# Patient Record
Sex: Female | Born: 1975 | Race: White | Hispanic: No | Marital: Single | Smoking: Never smoker
Health system: Southern US, Community
[De-identification: ages and names within clinical notes are randomized; demographics above are authoritative.]

## PROBLEM LIST (undated history)

## (undated) DIAGNOSIS — E215 Disorder of parathyroid gland, unspecified: Secondary | ICD-10-CM

## (undated) DIAGNOSIS — Z87442 Personal history of urinary calculi: Secondary | ICD-10-CM

## (undated) DIAGNOSIS — F329 Major depressive disorder, single episode, unspecified: Secondary | ICD-10-CM

## (undated) DIAGNOSIS — K219 Gastro-esophageal reflux disease without esophagitis: Secondary | ICD-10-CM

## (undated) DIAGNOSIS — R51 Headache: Secondary | ICD-10-CM

## (undated) DIAGNOSIS — N809 Endometriosis, unspecified: Secondary | ICD-10-CM

## (undated) DIAGNOSIS — F32A Depression, unspecified: Secondary | ICD-10-CM

## (undated) DIAGNOSIS — R519 Headache, unspecified: Secondary | ICD-10-CM

## (undated) DIAGNOSIS — Z87440 Personal history of urinary (tract) infections: Secondary | ICD-10-CM

## (undated) HISTORY — DX: Major depressive disorder, single episode, unspecified: F32.9

## (undated) HISTORY — DX: Depression, unspecified: F32.A

## (undated) HISTORY — DX: Headache, unspecified: R51.9

## (undated) HISTORY — DX: Disorder of parathyroid gland, unspecified: E21.5

## (undated) HISTORY — DX: Personal history of urinary (tract) infections: Z87.440

## (undated) HISTORY — DX: Gastro-esophageal reflux disease without esophagitis: K21.9

## (undated) HISTORY — DX: Headache: R51

## (undated) HISTORY — DX: Endometriosis, unspecified: N80.9

## (undated) HISTORY — DX: Personal history of urinary calculi: Z87.442

---

## 2010-03-13 ENCOUNTER — Ambulatory Visit: Payer: Self-pay | Admitting: Obstetrics and Gynecology

## 2010-03-26 ENCOUNTER — Ambulatory Visit: Payer: Self-pay | Admitting: Obstetrics and Gynecology

## 2010-11-12 ENCOUNTER — Ambulatory Visit: Payer: Self-pay | Admitting: Specialist

## 2011-10-14 ENCOUNTER — Ambulatory Visit: Payer: Self-pay | Admitting: Neurology

## 2012-01-14 HISTORY — PX: PARATHYROIDECTOMY: SHX19

## 2012-01-14 HISTORY — PX: EXCISION OF ENDOMETRIOMA: SHX6473

## 2012-01-14 HISTORY — PX: BONE TUMOR EXCISION: SHX1254

## 2012-01-14 HISTORY — PX: LITHOTRIPSY: SUR834

## 2012-01-22 ENCOUNTER — Ambulatory Visit: Payer: Self-pay | Admitting: Internal Medicine

## 2012-07-13 ENCOUNTER — Emergency Department: Payer: Self-pay | Admitting: Emergency Medicine

## 2012-07-13 LAB — CBC
HGB: 11.2 g/dL — ABNORMAL LOW (ref 12.0–16.0)
MCHC: 33 g/dL (ref 32.0–36.0)
MCV: 80 fL (ref 80–100)
RBC: 4.28 10*6/uL (ref 3.80–5.20)
RDW: 15.8 % — ABNORMAL HIGH (ref 11.5–14.5)
WBC: 12.8 10*3/uL — ABNORMAL HIGH (ref 3.6–11.0)

## 2012-07-13 LAB — URINALYSIS, COMPLETE
Bilirubin,UR: NEGATIVE
Glucose,UR: NEGATIVE mg/dL
Hyaline Cast: 7
Ketone: NEGATIVE
Nitrite: NEGATIVE
Ph: 6
Protein: NEGATIVE
RBC,UR: 27 /HPF
Specific Gravity: 1.018
Squamous Epithelial: 7
WBC UR: 553 /HPF

## 2012-07-13 LAB — COMPREHENSIVE METABOLIC PANEL
Albumin: 3.4 g/dL (ref 3.4–5.0)
Bilirubin,Total: 0.2 mg/dL (ref 0.2–1.0)
Chloride: 109 mmol/L — ABNORMAL HIGH (ref 98–107)
Co2: 26 mmol/L (ref 21–32)
Creatinine: 1.13 mg/dL (ref 0.60–1.30)
EGFR (Non-African Amer.): >60
Glucose: 135 mg/dL — ABNORMAL HIGH (ref 65–99)
Osmolality: 284 (ref 275–301)
SGPT (ALT): 21 U/L (ref 12–78)

## 2012-07-13 LAB — LIPASE, BLOOD: Lipase: 190 U/L (ref 73–393)

## 2012-07-29 ENCOUNTER — Ambulatory Visit: Payer: Self-pay | Admitting: Urology

## 2012-08-17 ENCOUNTER — Ambulatory Visit: Payer: Self-pay | Admitting: Obstetrics and Gynecology

## 2012-08-17 LAB — BASIC METABOLIC PANEL
Anion Gap: 3 — ABNORMAL LOW (ref 7–16)
Calcium, Total: 9.4 mg/dL (ref 8.5–10.1)
Chloride: 105 mmol/L (ref 98–107)
Co2: 30 mmol/L (ref 21–32)
Creatinine: 1.12 mg/dL (ref 0.60–1.30)
EGFR (African American): >60
EGFR (Non-African Amer.): >60
Glucose: 108 mg/dL — ABNORMAL HIGH (ref 65–99)
Osmolality: 277 (ref 275–301)

## 2012-08-17 LAB — PREGNANCY, URINE: Pregnancy Test, Urine: NEGATIVE m[IU]/mL

## 2012-08-17 LAB — CBC
HCT: 34.9 % — ABNORMAL LOW (ref 35.0–47.0)
MCHC: 33.4 g/dL (ref 32.0–36.0)
Platelet: 218 10*3/uL (ref 150–440)
RBC: 4.4 10*6/uL (ref 3.80–5.20)

## 2012-08-18 ENCOUNTER — Ambulatory Visit: Payer: Self-pay | Admitting: Urology

## 2012-08-27 ENCOUNTER — Ambulatory Visit: Payer: Self-pay | Admitting: Obstetrics and Gynecology

## 2012-09-17 ENCOUNTER — Ambulatory Visit: Payer: Self-pay | Admitting: Urology

## 2012-09-30 ENCOUNTER — Ambulatory Visit: Payer: Self-pay | Admitting: Urology

## 2012-10-06 ENCOUNTER — Ambulatory Visit: Payer: Self-pay | Admitting: Urology

## 2012-10-11 ENCOUNTER — Ambulatory Visit: Payer: Self-pay | Admitting: Urology

## 2013-03-09 ENCOUNTER — Ambulatory Visit: Payer: Self-pay | Admitting: Urgent Care

## 2013-04-13 ENCOUNTER — Ambulatory Visit: Payer: Self-pay | Admitting: Urology

## 2013-11-28 ENCOUNTER — Ambulatory Visit: Payer: Self-pay | Admitting: Nurse Practitioner

## 2014-05-05 NOTE — Op Note (Signed)
PATIENT NAME:  Maureen Aguilar, Maureen Aguilar MR#:  [id number] DATE OF BIRTH:  27-Aug-1975  DATE OF PROCEDURE:  08/27/2012  PREOPERATIVE DIAGNOSES: Pelvic pain, endometrioma, uterine polyp.  POSTOPERATIVE DIAGNOSES: Bilateral endometriomas with simple cysts on the left and a large cyst endometrioma on the right, multiple areas of endometrioma in the pelvis and cul-de-sac and normal cystoscopy postoperatively, with a polyp in the uterus.    PROCEDURE:operative laparoscopy and bilateral cystectomy, peritoenal biopsies, and placentn of intercede.  SURGEON: Delsa Sale, M.D.   ASSISTANT: Barnett Applebaum, M.D.  PROCEDURE PERFORMED: The patient was taken the operating room and placed in supine position. After adequate general endotracheal anesthesia was instilled, the patient was prepped and draped in the usual sterile fashion. The patient was placed in [place]. The side-opening speculum was placed in the patient's vagina. The anterior lip of the cervix was grasped and a Hulka tenaculum was placed into the uterus.  Attention was then turned to the umbilicus, where the umbilicus was injected with Marcaine. An incision was cut and carried sharply down to the fascia and Veress needle was placed. Hanging drop test, fluid instillation test and fluid aspiration test showed proper placement of the Veress needle. After the fluid was placed, the CO2 was placed on low flow. When tympany was heard around the liver, CO2 was turned up to high flow. Attention was then turned to the sides of the abdomen, and then first a right and then left port, a 5 and then a 10 port were placed. Upon admission, there was a lot of scarring to the bowels, to the anterior abdominal wall, as well as the ovaries and cul-de-sac. The left one was very close in proximity to the uterus and was adhered with endometriosis or what appeared to be. Prior to opening the uterine cysts, the ureters were searched for and identified bilaterally at the pelvic brim  and followed down to the pelvis. Attention was then turned to the right endometrioma which was grasped and then incised with a Harmonic scalpel. The shell of the endometrioma was peeled off at the very last moment when the endometrioma ruptured and the chocolate syrup came out to the belly. This was rapidly suctioned and irrigated. The patient's other ovary was then addressed. It was incised, opened the same way and then a simple cyst that was there was (Dictation Anodrained. . At this time, there was a large patch of endometriosis on the uterosacral as on the other patches, so these were allinhected with Marcaine to separate them from the background tissues. These were then grasped and removed with the Harmonic scalpel as superficially as possible. These were sent to pathology. There were 2 in the bladder flap, a couple anteriorly, a bunch on the uterosacrals and then some  actually in the folds of the bowels. The patient's tubes were quite reddened. None of this was grasped, none of this was (Dictation Anomaly)biopsied  The patient's trocar ports were removed, these were closed and then hysteroscopy was performed and (Dictation Anomaly) myosure was used to remove this polyp. After that, cystoscopy was done due to the endometriosis to see if it had come back, following up where I had seen it about this time a year ago. Attention was then turned to the cystoscope, where the patient's ureters were found to be patent bilaterally under direct visualization.     ____________________________ Delsa Sale, MD cck:nts D: 08/28/2012 01:09:53 ET T: 08/28/2012 04:16:52 ET JOB#: [id number]  cc: Delsa Sale, MD, <Dictator> Roxene Alviar  Clayburn Pert MD ELECTRONICALLY SIGNED 08/31/2012 9:21

## 2014-06-26 ENCOUNTER — Encounter: Payer: Self-pay | Admitting: Primary Care

## 2014-06-26 ENCOUNTER — Telehealth: Payer: Self-pay | Admitting: Primary Care

## 2014-06-26 ENCOUNTER — Ambulatory Visit (INDEPENDENT_AMBULATORY_CARE_PROVIDER_SITE_OTHER): Payer: 59 | Admitting: Primary Care

## 2014-06-26 VITALS — BP 122/84 | HR 82 | Temp 98.4°F | Ht 66.0 in | Wt 209.4 lb

## 2014-06-26 DIAGNOSIS — G44229 Chronic tension-type headache, not intractable: Secondary | ICD-10-CM | POA: Diagnosis not present

## 2014-06-26 DIAGNOSIS — K219 Gastro-esophageal reflux disease without esophagitis: Secondary | ICD-10-CM | POA: Insufficient documentation

## 2014-06-26 DIAGNOSIS — R51 Headache: Secondary | ICD-10-CM

## 2014-06-26 DIAGNOSIS — F32A Depression, unspecified: Secondary | ICD-10-CM

## 2014-06-26 DIAGNOSIS — R519 Headache, unspecified: Secondary | ICD-10-CM | POA: Insufficient documentation

## 2014-06-26 DIAGNOSIS — F329 Major depressive disorder, single episode, unspecified: Secondary | ICD-10-CM

## 2014-06-26 NOTE — Assessment & Plan Note (Signed)
Tolerable without medications. No migraine symptoms. Will continue to monitor.

## 2014-06-26 NOTE — Patient Instructions (Signed)
You will be contacted regarding your referral to Therapy.  Please let us know if you have not heard back within one week.   Please schedule a physical with me in the next 2-3 months. You will also schedule a lab only appointment one week prior. We will discuss your lab results during your physical.  It was a pleasure to meet you today! Please don't hesitate to call me with any questions. Welcome to [facility]!

## 2014-06-26 NOTE — Telephone Encounter (Signed)
Pt works at [employer] and would like to do her cpx labs there.  The [facility] station on [address] in [place] Her cpx appointment is 08/21/14

## 2014-06-26 NOTE — Assessment & Plan Note (Signed)
Intermittent for years. Takes omeprazole when symptoms arise. Denies chest pain, epigastric fullness, reflux recently. Will continue to monitor.

## 2014-06-26 NOTE — Progress Notes (Signed)
Pre visit review using our clinic review tool, if applicable. No additional management support is needed unless otherwise documented below in the visit note. 

## 2014-06-26 NOTE — Assessment & Plan Note (Signed)
PHQ-9 Score of 15 today. Denies SI/HI.  Recommended therapy and referral placed. Once managed on Lexapro and Citalopram, has not been on meds for 6 months. Will consider introducing after therapy.

## 2014-06-26 NOTE — Progress Notes (Signed)
Subjective:    Patient ID: Maureen Aguilar, female    DOB: Aug 02, 1975, 39 y.o.   MRN: [id number]  HPI  Ms. Maureen Aguilar is a 39 year old female who presents today to establish care and discuss the problems mentioned below. Will obtain old records.  1) Depression: She's been experiencing depression of the past 4 years due to a decrease in health status. Her PCP initiated her on Lexapro which she could not tolerate due to side effects. She was then placed on Citalopram with some improvement. She removed herself off of the Citalopram 6 months ago. PHQ-9 score of 15 today. Denies SI/HI.  2) GERD: Present for years. She experiences symptoms of epigastric fullness/pressure and was found to have a hiatal hernia several years ago. She is taking omeprazole and will take intermittently when needed (about once weekly).  3) Headaches: Present daily and doesn't particularly notice them. She does not currently take any medication including ibuprofen or tylenol.   4) Abdominal pain: Present for several years and is daily. Was following with GI and managed with Lupron for 6 months. Also followed with Urologist and GYN who could not find a reason for abdominal pain. Her CT scan in 2015 and was inconclusive. She does have a history of endometriosis but was not determined to be the cause. Denies nausea, vomiting, blood in stool. She's able to consume solids and liquids without difficulty.  Review of Systems  Constitutional: Positive for fatigue. Negative for unexpected weight change.  HENT: Negative for rhinorrhea.   Respiratory: Negative for cough and shortness of breath.   Cardiovascular: Negative for chest pain.  Gastrointestinal: Positive for abdominal pain. Negative for diarrhea and constipation.  Genitourinary: Negative for dysuria and frequency.  Musculoskeletal: Negative for myalgias and arthralgias.       Present to ankles and hips since treatment with Lupron.  Skin: Negative for rash.    Allergic/Immunologic: Negative for environmental allergies.  Neurological: Positive for headaches. Negative for dizziness and numbness.  Psychiatric/Behavioral:       See HPI.       Past Medical History  Diagnosis Date  . Depression   . Frequent headaches   . GERD (gastroesophageal reflux disease)   . History of kidney stones   . Parathyroid abnormality   . History of UTI   . Endometriosis     History   Social History  . Marital Status: Married    Spouse Name: N/A  . Number of Children: N/A  . Years of Education: N/A   Occupational History  . Not on file.   Social History Main Topics  . Smoking status: Never Smoker   . Smokeless tobacco: Not on file  . Alcohol Use: No  . Drug Use: Not on file  . Sexual Activity: Not on file   Other Topics Concern  . Not on file   Social History Narrative   Single.   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   Highest level of education 12th grade.   Enjoys reading, driving.    Past Surgical History  Procedure Laterality Date  . 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 Parathyroidectomy  2014    removal of one.  . Lithotripsy  2014  . Excision of endometrioma  2014    Family History  Problem Relation Age of Onset  . Alcohol abuse Mother   . Arthritis Mother   . Mental illness  Mother   . Mental illness Brother     bipolar  . Cancer Maternal Aunt     breast  . Mental illness Maternal Grandmother   . Asthma Maternal Grandmother   . Hyperlipidemia Maternal Grandmother   . Hypertension Maternal Grandmother   . Heart disease Maternal Grandfather     No Known Allergies  No current outpatient prescriptions on file prior to visit.   No current facility-administered medications on file prior to visit.    BP 122/84 mmHg  Pulse 82  Temp(Src) 98.4 F (36.9 C) (Oral)  Ht 5\' 6"  (1.676 m)  Wt 209 lb 6.4 oz (94.983 kg)  BMI 33.81 kg/m2  SpO2 98%  LMP 06/19/2014    Objective:    Physical Exam  Constitutional: She is oriented to person, place, and time. She appears well-nourished.  Cardiovascular: Normal rate and regular rhythm.   Neurological: She is alert and oriented to person, place, and time.  Skin: Skin is warm and dry.  Psychiatric: She has a normal mood and affect.          Assessment & Plan:

## 2014-07-03 ENCOUNTER — Other Ambulatory Visit: Payer: Self-pay | Admitting: Primary Care

## 2014-07-03 DIAGNOSIS — Z0000 Encounter for general adult medical examination without abnormal findings: Secondary | ICD-10-CM

## 2014-08-09 ENCOUNTER — Telehealth: Payer: Self-pay | Admitting: Primary Care

## 2014-08-09 NOTE — Telephone Encounter (Signed)
Called and notified patient of Kate's comments. Patient verbalized understanding.  

## 2014-08-09 NOTE — Telephone Encounter (Signed)
Yes, orders have been placed for [facility]. Please notify patient.

## 2014-08-09 NOTE — Telephone Encounter (Signed)
Pt called she has cpx on 8/8 and wanted to get her labs done at [facility]   Has order been sent  Please advise pt

## 2014-08-10 ENCOUNTER — Ambulatory Visit (INDEPENDENT_AMBULATORY_CARE_PROVIDER_SITE_OTHER)
Admission: RE | Admit: 2014-08-10 | Discharge: 2014-08-10 | Disposition: A | Discharge status: Home / Self Care | Payer: 59 | Source: Ambulatory Visit | Attending: Primary Care | Admitting: Primary Care

## 2014-08-10 ENCOUNTER — Ambulatory Visit (INDEPENDENT_AMBULATORY_CARE_PROVIDER_SITE_OTHER): Payer: 59 | Admitting: Primary Care

## 2014-08-10 ENCOUNTER — Encounter: Payer: Self-pay | Admitting: Primary Care

## 2014-08-10 VITALS — BP 124/84 | HR 89 | Temp 97.8°F | Ht 66.0 in | Wt 211.8 lb

## 2014-08-10 DIAGNOSIS — R109 Unspecified abdominal pain: Secondary | ICD-10-CM

## 2014-08-10 DIAGNOSIS — R339 Retention of urine, unspecified: Secondary | ICD-10-CM | POA: Diagnosis not present

## 2014-08-10 LAB — POCT URINALYSIS DIPSTICK
Bilirubin, UA: NEGATIVE
Blood, UA: NEGATIVE
GLUCOSE UA: NEGATIVE
KETONES UA: NEGATIVE
Leukocytes, UA: NEGATIVE
Nitrite, UA: NEGATIVE
Protein, UA: NEGATIVE
Spec Grav, UA: >=1.03
Urobilinogen, UA: NEGATIVE
pH, UA: 6

## 2014-08-10 NOTE — Progress Notes (Signed)
Subjective:    Patient ID: Maureen Aguilar, female    DOB: 1975-12-02, 39 y.o.   MRN: [id number]  HPI  Ms. Maureen Aguilar is a 39 year old female who presents today with a chief complaint of back pain. Her back pain is located to bilateral lower, lateral side of back and has been present for 2-3 weeks. She denies recent injury. She describes her pain as a deep, dull, ache. She's tried taking tylenol without relief. Nothing helps to improve her pain. 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 Denies dysuria, hematuria, frequency, fevers, chills.  Review of Systems  Constitutional: Negative for fever and chills.  Gastrointestinal: Negative for nausea and vomiting.  Genitourinary: Negative for dysuria, urgency, frequency, flank pain and vaginal discharge.       Feeling of residual urine after urinating.  Musculoskeletal: Positive for back pain.       Past Medical History  Diagnosis Date  . Depression   . Frequent headaches   . GERD (gastroesophageal reflux disease)   . History of kidney stones   . Parathyroid abnormality   . History of UTI   . Endometriosis     History   Social History  . Marital Status: Married    Spouse Name: N/A  . Number of Children: N/A  . Years of Education: N/A   Occupational History  . Not on file.   Social History Main Topics  . Smoking status: Never Smoker   . Smokeless tobacco: Not on file  . Alcohol Use: No  . Drug Use: Not on file  . Sexual Activity: Not on file   Other Topics Concern  . Not on file   Social History Narrative   Single.   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   Highest level of education 12th grade.   Enjoys reading, driving.    Past Surgical History  Procedure Laterality Date  . 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 Parathyroidectomy  2014    removal of one.  . Lithotripsy  2014  . Excision of endometrioma  2014    Family  History  Problem Relation Age of Onset  . Alcohol abuse Mother   . Arthritis Mother   . Mental illness Mother   . Mental illness Brother     bipolar  . Cancer Maternal Aunt     breast  . Mental illness Maternal Grandmother   . Asthma Maternal Grandmother   . Hyperlipidemia Maternal Grandmother   . Hypertension Maternal Grandmother   . Heart disease Maternal Grandfather     No Known Allergies  Current Outpatient Prescriptions on File Prior to Visit  Medication Sig Dispense Refill  . Norethindrone Acetate-Ethinyl Estrad-FE (LOESTRIN 24 FE) 1-20 MG-MCG(24) tablet Take 1 pill daily  3   No current facility-administered medications on file prior to visit.    BP 124/84 mmHg  Pulse 89  Temp(Src) 97.8 F (36.6 C) (Oral)  Ht 5\' 6"  (1.676 m)  Wt 211 lb 12.8 oz (96.072 kg)  BMI 34.20 kg/m2  SpO2 98%  LMP 07/19/2014    Objective:   Physical Exam  Constitutional: She appears well-nourished. She does not appear ill.  Cardiovascular: Normal rate and regular rhythm.   Pulmonary/Chest: Effort normal and breath sounds normal.  Abdominal: Soft. Bowel sounds are normal. There is CVA tenderness.  Skin: Skin is warm and dry.  Assessment & Plan:  Flank pain/Urinary retention:  Bilateral lateral low back/flank pain x 2-3 weeks. UA: Negative for leuks, nitrites, blood, glucose. Slight CVA tenderness. Will get KUB to rule out stones. Tylenol or ibuprofen PRN pain. Follow up PRN.

## 2014-08-10 NOTE — Patient Instructions (Signed)
Your urine does not show infection.  Complete xray(s) prior to leaving today. I will contact you regarding your results.  Please update me on your overall status. Try taking tylenol for your pain.  It was nice to see you!

## 2014-08-10 NOTE — Progress Notes (Signed)
Pre visit review using our clinic review tool, if applicable. No additional management support is needed unless otherwise documented below in the visit note. 

## 2014-08-17 ENCOUNTER — Other Ambulatory Visit: Payer: Self-pay | Admitting: Primary Care

## 2014-08-17 ENCOUNTER — Telehealth: Payer: Self-pay | Admitting: Primary Care

## 2014-08-17 DIAGNOSIS — Z0000 Encounter for general adult medical examination without abnormal findings: Secondary | ICD-10-CM

## 2014-08-17 NOTE — Telephone Encounter (Signed)
erro

## 2014-08-18 ENCOUNTER — Other Ambulatory Visit: Payer: Self-pay | Admitting: Primary Care

## 2014-08-19 LAB — CBC WITH DIFFERENTIAL/PLATELET
BASOS ABS: 0 10*3/uL (ref 0.0–0.2)
Basos: 0 %
EOS (ABSOLUTE): 0.1 10*3/uL (ref 0.0–0.4)
EOS: 1 %
HEMATOCRIT: 40.9 % (ref 34.0–46.6)
Hemoglobin: 13.9 g/dL (ref 11.1–15.9)
IMMATURE GRANULOCYTES: 0 %
Immature Grans (Abs): 0 10*3/uL (ref 0.0–0.1)
LYMPHS: 21 %
Lymphocytes Absolute: 2 10*3/uL (ref 0.7–3.1)
MCH: 29.9 pg (ref 26.6–33.0)
MCHC: 34 g/dL (ref 31.5–35.7)
MCV: 88 fL (ref 79–97)
Monocytes Absolute: 0.8 10*3/uL (ref 0.1–0.9)
Monocytes: 8 %
NEUTROS PCT: 70 %
Neutrophils Absolute: 6.8 10*3/uL (ref 1.4–7.0)
Platelets: 276 10*3/uL (ref 150–379)
RBC: 4.65 x10E6/uL (ref 3.77–5.28)
RDW: 12.8 % (ref 12.3–15.4)
WBC: 9.7 10*3/uL (ref 3.4–10.8)

## 2014-08-19 LAB — LIPID PANEL
Chol/HDL Ratio: 3.8 ratio units (ref 0.0–4.4)
Cholesterol, Total: 176 mg/dL (ref 100–199)
HDL: 46 mg/dL (ref >39)
LDL Calculated: 85 mg/dL (ref 0–99)
Triglycerides: 227 mg/dL — ABNORMAL HIGH (ref 0–149)
VLDL Cholesterol Cal: 45 mg/dL — ABNORMAL HIGH (ref 5–40)

## 2014-08-19 LAB — HEMOGLOBIN A1C
Est. average glucose Bld gHb Est-mCnc: 114 mg/dL
HEMOGLOBIN A1C: 5.6 % (ref 4.8–5.6)

## 2014-08-19 LAB — COMPREHENSIVE METABOLIC PANEL
A/G RATIO: 1.5 (ref 1.1–2.5)
ALBUMIN: 4.2 g/dL (ref 3.5–5.5)
ALK PHOS: 45 IU/L (ref 39–117)
ALT: 17 IU/L (ref 0–32)
AST: 13 IU/L (ref 0–40)
BUN/Creatinine Ratio: 15 (ref 9–23)
BUN: 15 mg/dL (ref 6–24)
Bilirubin Total: 0.3 mg/dL (ref 0.0–1.2)
CALCIUM: 9.8 mg/dL (ref 8.7–10.2)
CO2: 22 mmol/L (ref 18–29)
Chloride: 100 mmol/L (ref 97–108)
Creatinine, Ser: 1 mg/dL (ref 0.57–1.00)
GFR calc Af Amer: 81 mL/min/{1.73_m2} (ref >59)
GFR, EST NON AFRICAN AMERICAN: 70 mL/min/{1.73_m2} (ref >59)
Globulin, Total: 2.8 g/dL (ref 1.5–4.5)
Glucose: 92 mg/dL (ref 65–99)
POTASSIUM: 4.8 mmol/L (ref 3.5–5.2)
Sodium: 139 mmol/L (ref 134–144)
TOTAL PROTEIN: 7 g/dL (ref 6.0–8.5)

## 2014-08-19 LAB — VITAMIN D 25 HYDROXY (VIT D DEFICIENCY, FRACTURES): Vit D, 25-Hydroxy: 33.1 ng/mL (ref 30.0–100.0)

## 2014-08-19 LAB — TSH: TSH: 2.21 u[IU]/mL (ref 0.450–4.500)

## 2014-08-21 ENCOUNTER — Ambulatory Visit (INDEPENDENT_AMBULATORY_CARE_PROVIDER_SITE_OTHER): Payer: 59 | Admitting: Primary Care

## 2014-08-21 ENCOUNTER — Encounter: Payer: Self-pay | Admitting: Primary Care

## 2014-08-21 ENCOUNTER — Other Ambulatory Visit: Payer: Self-pay | Admitting: Primary Care

## 2014-08-21 VITALS — BP 120/84 | HR 80 | Temp 98.5°F | Ht 66.0 in | Wt 207.8 lb

## 2014-08-21 DIAGNOSIS — Z0000 Encounter for general adult medical examination without abnormal findings: Secondary | ICD-10-CM | POA: Diagnosis not present

## 2014-08-21 DIAGNOSIS — F32A Depression, unspecified: Secondary | ICD-10-CM

## 2014-08-21 DIAGNOSIS — Z23 Encounter for immunization: Secondary | ICD-10-CM

## 2014-08-21 DIAGNOSIS — F329 Major depressive disorder, single episode, unspecified: Secondary | ICD-10-CM

## 2014-08-21 DIAGNOSIS — Z1211 Encounter for screening for malignant neoplasm of colon: Secondary | ICD-10-CM | POA: Insufficient documentation

## 2014-08-21 NOTE — Assessment & Plan Note (Signed)
Due for tetanus today. Will administer. Labs mostly unremarkable. Trigs up, will initiate daily fish oil.  Vitamin D on lower level of normal. Will initial OTC vitamin D with calcium Exam unremarkable. Follows with GYN for Paps. She will schedule a mammogram.

## 2014-08-21 NOTE — Progress Notes (Signed)
Pre visit review using our clinic review tool, if applicable. No additional management support is needed unless otherwise documented below in the visit note. 

## 2014-08-21 NOTE — Addendum Note (Signed)
Addended by: Jacqualin Combes on: 08/21/2014 01:32 PM   Modules accepted: Orders

## 2014-08-21 NOTE — Progress Notes (Signed)
Subjective:    Patient ID: Maureen Aguilar, female    DOB: 03/19/1975, 40 y.o.   MRN: [id number]  HPI  Ms. Maureen Aguilar is a 39 year old female who presents today for complete physical.  Immunizations: -Tetanus: Unsure of last tetanus. Believes it's been more than 10 years ago.  -Influenza: Did not complete last season.   Diet: Endorses a healthy diet. Breakfast: Cereal (cherrios), toast Lunch: Skips Dinner: Baked, grilled chicken. Limited red meat or pork. Vegetables, rice. Beverages: Water, lipton diet green tea Exercise: She is not currently exercising Eye exam: Unsure. Denies changes in vision. Dental exam: Completed earlier in 2016. Pap Smear: Completed in 2015, follows with GYN Mammogram: Completed 1 year ago. Normal exam.  1) Depression: During initial visit in June, noted a PHQ-9 score of 15. She was referred to therapy as she was once managed on medications. She works night shift and has been unable to coordinate an appointment with therapy as of yet.   Review of Systems  Constitutional: Negative for unexpected weight change.  HENT: Negative for rhinorrhea.   Eyes: Negative for visual disturbance.  Respiratory: Negative for cough and shortness of breath.   Cardiovascular: Negative for chest pain.  Gastrointestinal: Negative for diarrhea and constipation.  Genitourinary: Negative for difficulty urinating.       Regular periods  Musculoskeletal: Negative for myalgias and arthralgias.  Skin: Negative for rash.  Neurological: Negative for dizziness, numbness and headaches.  Psychiatric/Behavioral:       Denies concerns for anxiety or depression       Past Medical History  Diagnosis Date  . Depression   . Frequent headaches   . GERD (gastroesophageal reflux disease)   . History of kidney stones   . Parathyroid abnormality   . History of UTI   . Endometriosis     History   Social History  . Marital Status: Married    Spouse Name: N/A  . Number of Children:  N/A  . Years of Education: N/A   Occupational History  . Not on file.   Social History Main Topics  . Smoking status: Never Smoker   . Smokeless tobacco: Not on file  . Alcohol Use: No  . Drug Use: Not on file  . Sexual Activity: Not on file   Other Topics Concern  . Not on file   Social History Narrative   Single.   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   Highest level of education 12th grade.   Enjoys reading, driving.    Past Surgical History  Procedure Laterality Date  . 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 Parathyroidectomy  2014    removal of one.  . Lithotripsy  2014  . Excision of endometrioma  2014    Family History  Problem Relation Age of Onset  . Alcohol abuse Mother   . Arthritis Mother   . Mental illness Mother   . Mental illness Brother     bipolar  . Cancer Maternal Aunt     breast  . Mental illness Maternal Grandmother   . Asthma Maternal Grandmother   . Hyperlipidemia Maternal Grandmother   . Hypertension Maternal Grandmother   . Heart disease Maternal Grandfather     No Known Allergies  Current Outpatient Prescriptions on File Prior to Visit  Medication Sig Dispense Refill  . Norethindrone Acetate-Ethinyl Estrad-FE (LOESTRIN 24 FE) 1-20 MG-MCG(24) tablet Take 1  pill daily  3   No current facility-administered medications on file prior to visit.    BP 120/84 mmHg  Pulse 80  Temp(Src) 98.5 F (36.9 C) (Oral)  Ht 5\' 6"  (1.676 m)  Wt 207 lb 12.8 oz (94.257 kg)  BMI 33.56 kg/m2  SpO2 98%  LMP 07/19/2014    Objective:   Physical Exam  Constitutional: She is oriented to person, place, and time. She appears well-nourished.  HENT:  Right Ear: Tympanic membrane and ear canal normal.  Left Ear: Tympanic membrane and ear canal normal.  Nose: Nose normal.  Mouth/Throat: Oropharynx is clear and moist.  Eyes: Conjunctivae and EOM are normal. Pupils are equal, round, and reactive to light.    Neck: Neck supple. No thyromegaly present.  Cardiovascular: Normal rate and regular rhythm.   Pulmonary/Chest: Effort normal and breath sounds normal.  Abdominal: Soft. Bowel sounds are normal. There is no tenderness.  Musculoskeletal: Normal range of motion.  Lymphadenopathy:    She has no cervical adenopathy.  Neurological: She is alert and oriented to person, place, and time. She has normal reflexes.  Skin: Skin is warm and dry.  Psychiatric: She has a normal mood and affect.          Assessment & Plan:

## 2014-08-21 NOTE — Assessment & Plan Note (Addendum)
Has not connected with therapy. Will have her stop by today and speak with [facility] regarding an appointment. Follow up in 3 months.

## 2014-08-21 NOTE — Patient Instructions (Signed)
You've been provided with a tetanus vaccination today. You will be covered for 10 years.  Start taking daily Fish Oil 2000mg , which may be purchased over the counter.  Start taking a calcium with vitamin D supplement. You need at least 1200 mg of calcium and 800 units of vitamin D.  Stop by the front and speak with Rosaria Ferries regarding your referral to therapy.  Follow up in 3 months for re-evaluation of depression.  It was a pleasure to see you today!

## 2014-10-26 ENCOUNTER — Telehealth: Payer: Self-pay | Admitting: Primary Care

## 2014-10-26 NOTE — Telephone Encounter (Signed)
Noted. Do they have the correct contact information?

## 2014-10-26 NOTE — Telephone Encounter (Signed)
FYI: Received staff message from [facility] ([place]) that they have reached out to the patient to schedule. Pt has not returned any of their calls.

## 2014-10-27 NOTE — Telephone Encounter (Signed)
Yes. They have access to Epic

## 2014-11-03 ENCOUNTER — Telehealth: Payer: Self-pay | Admitting: Primary Care

## 2014-11-03 NOTE — Telephone Encounter (Signed)
Form placed in Chan's box and is ready for pick up.

## 2014-11-03 NOTE — Telephone Encounter (Signed)
Pt dropped off an appeal form for her health screening for work. The best number to contact her is [phone]. Placing ppw in Kate's tower.

## 2014-11-03 NOTE — Telephone Encounter (Signed)
Place form in Kate's inbox. 

## 2014-11-06 NOTE — Telephone Encounter (Signed)
Called and notified patient of Kate's comments. Patient verbalized understanding. Will be faxing the form to e[employer] screening at [phone]. Sent thru the mail, the original for patient and copy to be scan.

## 2014-11-20 ENCOUNTER — Ambulatory Visit: Payer: 59 | Admitting: Primary Care

## 2015-03-28 ENCOUNTER — Encounter: Payer: Self-pay | Admitting: Primary Care

## 2015-03-28 ENCOUNTER — Ambulatory Visit (INDEPENDENT_AMBULATORY_CARE_PROVIDER_SITE_OTHER): Payer: 59 | Admitting: Primary Care

## 2015-03-28 VITALS — BP 122/84 | HR 87 | Temp 98.4°F | Ht 66.0 in | Wt 207.4 lb

## 2015-03-28 DIAGNOSIS — G44209 Tension-type headache, unspecified, not intractable: Secondary | ICD-10-CM

## 2015-03-28 DIAGNOSIS — G44229 Chronic tension-type headache, not intractable: Secondary | ICD-10-CM | POA: Diagnosis not present

## 2015-03-28 MED ORDER — SUMATRIPTAN SUCCINATE 25 MG PO TABS: ORAL_TABLET | ORAL | Status: DC

## 2015-03-28 NOTE — Patient Instructions (Signed)
Take 1 Imitrex (sumatriptan) tablet when you get home for your headache. You may take a second tablet in 2 hours if no resolve. Do not exceed 2 tablets in 24 hours.   Please call me if no improvement.  It was a pleasure to see you today!

## 2015-03-28 NOTE — Progress Notes (Signed)
Subjective:    Patient ID: Maureen Aguilar, female    DOB: March 09, 1975, 40 y.o.   MRN: [id number]  HPI  [name] is a 40 year old female who presents today with a chief complaint of headache. Her headache is located to the entire cranium, but worse to the bilateral temporal region. She describes her pain as stinging, pressure, and pinching. She reports nausea without vomiting and denies photophobia. Her headache began 1.5 weeks ago and has been persistent. Her pain is no worse, but has not improved. She's taken tylenol, ibuprofen, excedrin migraine without improvement.   Review of Systems  Constitutional: Negative for fever and chills.  HENT: Negative for congestion, sinus pressure and sore throat.   Eyes: Negative for photophobia and visual disturbance.  Respiratory: Negative for cough.   Gastrointestinal: Positive for nausea.  Neurological: Positive for dizziness and headaches.       Past Medical History  Diagnosis Date  . Depression   . Frequent headaches   . GERD (gastroesophageal reflux disease)   . History of kidney stones   . Parathyroid abnormality ([place])   . History of UTI   . Endometriosis     Social History   Social History  . Marital Status: Married    Spouse Name: N/A  . Number of Children: N/A  . Years of Education: N/A   Occupational History  . Not on file.   Social History Main Topics  . Smoking status: Never Smoker   . Smokeless tobacco: Not on file  . Alcohol Use: No  . Drug Use: Not on file  . Sexual Activity: Not on file   Other Topics Concern  . Not on file   Social History Narrative   Single.   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   Highest level of education 12th grade.   Enjoys reading, driving.    Past Surgical History  Procedure Laterality Date  . 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 Parathyroidectomy  2014    removal of one.  . Lithotripsy  2014  . Excision of endometrioma  2014     Family History  Problem Relation Age of Onset  . Alcohol abuse Mother   . Arthritis Mother   . Mental illness Mother   . Mental illness Brother     bipolar  . Cancer Maternal Aunt     breast  . Mental illness Maternal Grandmother   . Asthma Maternal Grandmother   . Hyperlipidemia Maternal Grandmother   . Hypertension Maternal Grandmother   . Heart disease Maternal Grandfather     No Known Allergies  Current Outpatient Prescriptions on File Prior to Visit  Medication Sig Dispense Refill  . Norethindrone Acetate-Ethinyl Estrad-FE (LOESTRIN 24 FE) 1-20 MG-MCG(24) tablet Take 1 pill daily  3   No current facility-administered medications on file prior to visit.    BP 122/84 mmHg  Pulse 87  Temp(Src) 98.4 F (36.9 C) (Oral)  Ht 5\' 6"  (1.676 m)  Wt 207 lb 6.4 oz (94.076 kg)  BMI 33.49 kg/m2  SpO2 97%  LMP 03/07/2015    Objective:   Physical Exam  Constitutional: She appears well-nourished.  Eyes: EOM are normal. Pupils are equal, round, and reactive to light.  Cardiovascular: Normal rate and regular rhythm.   Pulmonary/Chest: Effort normal and breath sounds normal.  Neurological: No cranial nerve deficit.  Skin: Skin is warm and dry.  Assessment & Plan:

## 2015-03-28 NOTE — Progress Notes (Signed)
Pre visit review using our clinic review tool, if applicable. No additional management support is needed unless otherwise documented below in the visit note. 

## 2015-03-28 NOTE — Assessment & Plan Note (Signed)
Persistent headache for 1.5 weeks without improvement in OTC treatment. +nausea, no photophobia. Appears to be tension headache that is unresponsive to conservative treatment. Neuro exam unremarkable. FH of aneurysm to brain. Will treat with abortive therapy. RX for Imitrex sent to pharmacy. Discussed dosing and how to take. She will keep me updated if no improvement.

## 2015-04-01 ENCOUNTER — Encounter: Payer: Self-pay | Admitting: Primary Care

## 2015-04-02 ENCOUNTER — Other Ambulatory Visit: Payer: Self-pay | Admitting: Primary Care

## 2015-04-02 DIAGNOSIS — G43801 Other migraine, not intractable, with status migrainosus: Secondary | ICD-10-CM

## 2015-04-02 MED ORDER — SUMATRIPTAN SUCCINATE 50 MG PO TABS: ORAL_TABLET | ORAL | Status: DC

## 2015-05-28 ENCOUNTER — Ambulatory Visit (INDEPENDENT_AMBULATORY_CARE_PROVIDER_SITE_OTHER): Payer: 59 | Admitting: Internal Medicine

## 2015-05-28 ENCOUNTER — Ambulatory Visit (INDEPENDENT_AMBULATORY_CARE_PROVIDER_SITE_OTHER)
Admission: RE | Admit: 2015-05-28 | Discharge: 2015-05-28 | Disposition: A | Discharge status: Home / Self Care | Payer: 59 | Source: Ambulatory Visit | Attending: Internal Medicine | Admitting: Internal Medicine

## 2015-05-28 ENCOUNTER — Telehealth: Payer: Self-pay | Admitting: Primary Care

## 2015-05-28 ENCOUNTER — Encounter: Payer: Self-pay | Admitting: Internal Medicine

## 2015-05-28 VITALS — BP 124/86 | HR 89 | Temp 99.3°F | Wt 209.0 lb

## 2015-05-28 DIAGNOSIS — R0602 Shortness of breath: Secondary | ICD-10-CM | POA: Diagnosis not present

## 2015-05-28 DIAGNOSIS — R059 Cough, unspecified: Secondary | ICD-10-CM

## 2015-05-28 DIAGNOSIS — R05 Cough: Secondary | ICD-10-CM

## 2015-05-28 DIAGNOSIS — R509 Fever, unspecified: Secondary | ICD-10-CM | POA: Diagnosis not present

## 2015-05-28 NOTE — Patient Instructions (Signed)

## 2015-05-28 NOTE — Progress Notes (Signed)
HPI  Pt presents to the clinic today with c/o runny nose, cough and shortness of breath. This started 10 days ago. She was seen at [facility] for the same 5/7, diagnosed with bronchitis and treated with Prednisone, cough syrup and an inhaler. She has not noticed much improvement. She is blowing clear mucous out of her nose. The cough is non productive. She is mildly short of breath but denies chest pain. She has run low grade fever and had chills. She has taken Mucinex, Tessalon and Robitussin with minimal relief. She has no history of allergies or asthma. She does not smoke. She has not had sick contacts that she is aware of. She is on OCP, but has not noticed any swelling or pain in her legs. She has no family history of clotting disorders.  Review of Systems      Past Medical History  Diagnosis Date  . Depression   . Frequent headaches   . GERD (gastroesophageal reflux disease)   . History of kidney stones   . Parathyroid abnormality ([place])   . History of UTI   . Endometriosis     Family History  Problem Relation Age of Onset  . Alcohol abuse Mother   . Arthritis Mother   . Mental illness Mother   . Mental illness Brother     bipolar  . Cancer Maternal Aunt     breast  . Mental illness Maternal Grandmother   . Asthma Maternal Grandmother   . Hyperlipidemia Maternal Grandmother   . Hypertension Maternal Grandmother   . Heart disease Maternal Grandfather     Social History   Social History  . Marital Status: Married    Spouse Name: N/A  . Number of Children: N/A  . Years of Education: N/A   Occupational History  . Not on file.   Social History Main Topics  . Smoking status: Never Smoker   . Smokeless tobacco: Not on file  . Alcohol Use: No  . Drug Use: Not on file  . Sexual Activity: Not on file   Other Topics Concern  . Not on file   Social History Narrative   Single.   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   Highest level of education  12th grade.   Enjoys reading, driving.    No Known Allergies   Constitutional: Positive fever. Denies headache, fatigue or abrupt weight changes.  HEENT:  Positive runny nose. Denies eye redness, eye pain, pressure behind the eyes, facial pain, nasal congestion, ear pain, ringing in the ears, wax buildup or sore throat. Respiratory: Positive cough and shortness of breath. Denies difficulty breathing.  Cardiovascular: Denies chest pain, chest tightness, palpitations or swelling in the hands or feet.   No other specific complaints in a complete review of systems (except as listed in HPI above).  Objective:   BP 124/86 mmHg  Pulse 89  Temp(Src) 99.3 F (37.4 C) (Oral)  Wt 209 lb (94.802 kg)  SpO2 97%  LMP 04/28/2015 Wt Readings from Last 3 Encounters:  05/28/15 209 lb (94.802 kg)  03/28/15 207 lb 6.4 oz (94.076 kg)  08/21/14 207 lb 12.8 oz (94.257 kg)     General: Appears her stated age, in NAD. HEENT: Head: normal shape and size; Eyes: sclera white, no icterus, conjunctiva pink; Throat/Mouth: + PND. Teeth present, mucosa pink and moist, no exudate noted, no lesions or ulcerations noted.  Neck: Cervical lymphadenopathy bilaterally.  Cardiovascular: Normal rate and rhythm.  S1,S2 noted.  No murmur, rubs or gallops noted.  Pulmonary/Chest: Normal effort and positive vesicular breath sounds. No respiratory distress. No wheezes, rales or ronchi noted.      Assessment & Plan:   Fever, Cough and SOB:  ? Post infectious cough vs PNA Get some rest and drink plenty of water Chest xray to r/o pneumonia If noted infiltrate, will call in Azithromax Otherwise, continue Tessalon, try Delsym OTC  RTC as needed or if symptoms persist.

## 2015-05-28 NOTE — Telephone Encounter (Signed)
Opened in error

## 2015-05-28 NOTE — Progress Notes (Signed)
Pre visit review using our clinic review tool, if applicable. No additional management support is needed unless otherwise documented below in the visit note. 

## 2015-10-15 ENCOUNTER — Telehealth: Payer: Self-pay | Admitting: Primary Care

## 2015-10-15 ENCOUNTER — Encounter: Payer: Self-pay | Admitting: Primary Care

## 2015-10-15 ENCOUNTER — Ambulatory Visit (INDEPENDENT_AMBULATORY_CARE_PROVIDER_SITE_OTHER): Payer: 59 | Admitting: Primary Care

## 2015-10-15 ENCOUNTER — Ambulatory Visit (INDEPENDENT_AMBULATORY_CARE_PROVIDER_SITE_OTHER)
Admission: RE | Admit: 2015-10-15 | Discharge: 2015-10-15 | Disposition: A | Discharge status: Home / Self Care | Payer: 59 | Source: Ambulatory Visit | Attending: Primary Care | Admitting: Primary Care

## 2015-10-15 VITALS — BP 136/86 | HR 85 | Temp 99.0°F | Ht 66.0 in | Wt 208.1 lb

## 2015-10-15 DIAGNOSIS — M545 Low back pain: Secondary | ICD-10-CM | POA: Diagnosis not present

## 2015-10-15 DIAGNOSIS — G8929 Other chronic pain: Secondary | ICD-10-CM

## 2015-10-15 MED ORDER — TIZANIDINE HCL 2 MG PO TABS: 2.0000 mg | ORAL_TABLET | Freq: Three times a day (TID) | ORAL | 0 refills | Status: DC | PRN

## 2015-10-15 NOTE — Telephone Encounter (Signed)
Pt dropped off ehealth screening  In kates IN BOX

## 2015-10-15 NOTE — Progress Notes (Signed)
Subjective:    Patient ID: Maureen Aguilar, female    DOB: March 06, 1975, 40 y.o.   MRN: [id number]  HPI  Maureen Aguilar is a 40 year old female who presents today with a chief complaint of low back pain. Her pain is located to the middle of her lower back and has been present intermittently for the past 20 years. Her pain became amplified 3 years ago after receiving Lupron injections, and her pain has gradually persisted since. Her pain will radiate around to her bilateral groin.   Since March 2017 her pain has progressed after attempts to exercise on an elliptical she purchased. Her pain is worse with bending forward and also during sleep. She saw her Urologist in March 2017 for follow up of prior renal stones who saw abnormalities to her lower back through and xray and recommended further evaluation. She was provided with a prescription for Tizanidine 4 mg tablets from her Urologist which she's been taking at bed time to help with sleep due to back pain. She's been taking tylenol and ibuprofen during the day without improvement. Denies hematuria, fevers, urinary frequency, flank pain, numbness/tingling to her lower extremities, recent injury/trauma.    Review of Systems  Constitutional: Negative for fever.  Genitourinary: Negative for dysuria, flank pain and frequency.       No saddle anesthesia   Musculoskeletal: Positive for back pain.  Neurological: Negative for numbness.       Past Medical History:  Diagnosis Date  . Depression   . Endometriosis   . Frequent headaches   . GERD (gastroesophageal reflux disease)   . History of kidney stones   . History of UTI   . Parathyroid abnormality [facility])      Social History   Social History  . Marital status: Married    Spouse name: N/A  . Number of children: N/A  . Years of education: N/A   Occupational History  . Not on file.   Social History Main Topics  . Smoking status: Never Smoker  . Smokeless tobacco: Not on file  . Alcohol  use No  . Drug use: Unknown  . Sexual activity: Not on file   Other Topics Concern  . Not on file   Social History Narrative   Single.   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   Highest level of education 12th grade.   Enjoys reading, driving.    Past Surgical History:  Procedure Laterality Date  . 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 EXCISION OF ENDOMETRIOMA  2014  . LITHOTRIPSY  2014  . PARATHYROIDECTOMY  2014   removal of one.    Family History  Problem Relation Age of Onset  . Alcohol abuse Mother   . Arthritis Mother   . Mental illness Mother   . Mental illness Brother     bipolar  . Cancer Maternal Aunt     breast  . Mental illness Maternal Grandmother   . Asthma Maternal Grandmother   . Hyperlipidemia Maternal Grandmother   . Hypertension Maternal Grandmother   . Heart disease Maternal Grandfather     No Known Allergies  Current Outpatient Prescriptions on File Prior to Visit  Medication Sig Dispense Refill  . Norethindrone Acetate-Ethinyl Estrad-FE (LOESTRIN 24 FE) 1-20 MG-MCG(24) tablet Take 1 pill daily  3  . SUMAtriptan (IMITREX) 50 MG tablet Take 1 tablet at migraine onset. May repeat in 2  hours if no resolve. 10 tablet 0   No current facility-administered medications on file prior to visit.     BP 136/86   Pulse 85   Temp 99 F (37.2 C) (Oral)   Ht 5\' 6"  (1.676 m)   Wt 208 lb 1.9 oz (94.4 kg)   LMP 10/01/2015   SpO2 99%   BMI 33.59 kg/m    Objective:   Physical Exam  Constitutional: She appears well-nourished.  Neck: Neck supple.  Cardiovascular: Normal rate and regular rhythm.   Pulmonary/Chest: Effort normal and breath sounds normal.  Musculoskeletal:       Right shoulder: She exhibits pain. She exhibits normal range of motion, no tenderness, no deformity and normal strength.  Negative straight leg raise bilaterally  Skin: Skin is warm and dry.          Assessment & Plan:

## 2015-10-15 NOTE — Assessment & Plan Note (Signed)
Ongoing for 20 years, worse since March 2017. No radiculopathy. Exam today stable. Will start with plain films of lumbar spine. Likely will proceed to physical therapy. If no improvement, consider neurosurgery referral. Rx for Tizanidine provided to use HS PRN.

## 2015-10-15 NOTE — Patient Instructions (Signed)
I sent a prescription for Tizanidine 2 mg tablets. Take 1 tablet by mouth every 8 hours as needed. Start at bedtime.  Complete xray(s) prior to leaving today. I will notify you of your results once received.  It was a pleasure to see you today!   Back Pain, Adult Back pain is very common in adults.The cause of back pain is rarely dangerous and the pain often gets better over time.The cause of your back pain may not be known. Some common causes of back pain include:  Strain of the muscles or ligaments supporting the spine.  Wear and tear (degeneration) of the spinal disks.  Arthritis.  Direct injury to the back. For many people, back pain may return. Since back pain is rarely dangerous, most people can learn to manage this condition on their own. HOME CARE INSTRUCTIONS Watch your back pain for any changes. The following actions may help to lessen any discomfort you are feeling:  Remain active. It is stressful on your back to sit or stand in one place for long periods of time. Do not sit, drive, or stand in one place for more than 30 minutes at a time. Take short walks on even surfaces as soon as you are able.Try to increase the length of time you walk each day.  Exercise regularly as directed by your health care provider. Exercise helps your back heal faster. It also helps avoid future injury by keeping your muscles strong and flexible.  Do not stay in bed.Resting more than 1-2 days can delay your recovery.  Pay attention to your body when you bend and lift. The most comfortable positions are those that put less stress on your recovering back. Always use proper lifting techniques, including:  Bending your knees.  Keeping the load close to your body.  Avoiding twisting.  Find a comfortable position to sleep. Use a firm mattress and lie on your side with your knees slightly bent. If you lie on your back, put a pillow under your knees.  Avoid feeling anxious or stressed.Stress  increases muscle tension and can worsen back pain.It is important to recognize when you are anxious or stressed and learn ways to manage it, such as with exercise.  Take medicines only as directed by your health care provider. Over-the-counter medicines to reduce pain and inflammation are often the most helpful.Your health care provider may prescribe muscle relaxant drugs.These medicines help dull your pain so you can more quickly return to your normal activities and healthy exercise.  Apply ice to the injured area:  Put ice in a plastic bag.  Place a towel between your skin and the bag.  Leave the ice on for 20 minutes, 2-3 times a day for the first 2-3 days. After that, ice and heat may be alternated to reduce pain and spasms.  Maintain a healthy weight. Excess weight puts extra stress on your back and makes it difficult to maintain good posture. SEEK MEDICAL CARE IF:  You have pain that is not relieved with rest or medicine.  You have increasing pain going down into the legs or buttocks.  You have pain that does not improve in one week.  You have night pain.  You lose weight.  You have a fever or chills. SEEK IMMEDIATE MEDICAL CARE IF:   You develop new bowel or bladder control problems.  You have unusual weakness or numbness in your arms or legs.  You develop nausea or vomiting.  You develop abdominal pain.  You feel  faint.   This information is not intended to replace advice given to you by your health care provider. Make sure you discuss any questions you have with your health care provider.   Document Released: 12/30/2004 Document Revised: 01/20/2014 Document Reviewed: 05/03/2013 Elsevier Interactive Patient Education [employer].

## 2015-10-15 NOTE — Progress Notes (Signed)
Pre visit review using our clinic review tool, if applicable. No additional management support is needed unless otherwise documented below in the visit note. 

## 2015-10-15 NOTE — Telephone Encounter (Signed)
Noted. Placed in Chan's inbox. Ready for pick up.

## 2015-10-16 ENCOUNTER — Other Ambulatory Visit: Payer: Self-pay | Admitting: Primary Care

## 2015-10-16 ENCOUNTER — Encounter: Payer: Self-pay | Admitting: Primary Care

## 2015-10-16 DIAGNOSIS — M545 Low back pain: Principal | ICD-10-CM

## 2015-10-16 DIAGNOSIS — G8929 Other chronic pain: Secondary | ICD-10-CM

## 2015-10-16 NOTE — Telephone Encounter (Signed)
Spoken and notified patient of Maureen Aguilar's comments. Patient verbalized understanding.  Patient will pick up tomorrow.

## 2015-12-29 ENCOUNTER — Other Ambulatory Visit: Payer: Self-pay | Admitting: Primary Care

## 2015-12-29 DIAGNOSIS — M545 Low back pain: Principal | ICD-10-CM

## 2015-12-29 DIAGNOSIS — G8929 Other chronic pain: Secondary | ICD-10-CM

## 2015-12-31 NOTE — Telephone Encounter (Signed)
Ok to refill? Electronically refill request for   tiZANidine (ZANAFLEX) 2 MG tablet  Last prescribed and seen on 10/15/2015.

## 2016-10-23 ENCOUNTER — Telehealth: Payer: Self-pay | Admitting: *Deleted

## 2016-10-23 NOTE — Telephone Encounter (Signed)
Patient came in office and dropped office a Health screening appeal form for Maureen Aguilar to sign and look at. Patient states you can call when it is completed or if you have any questions . Her contact number is [phone]. It will be in the RX tower. Thanks

## 2016-10-23 NOTE — Telephone Encounter (Signed)
Completed and placed in Chans inbox. 

## 2016-10-23 NOTE — Telephone Encounter (Signed)
Left in Kate's inbox for review and complete.

## 2016-10-24 NOTE — Telephone Encounter (Signed)
Per DPR, left detail message that form is ready and left form in the front office so patient can pick up at her convenience.

## 2016-10-30 ENCOUNTER — Encounter: Payer: Self-pay | Admitting: Primary Care

## 2016-10-30 ENCOUNTER — Ambulatory Visit (INDEPENDENT_AMBULATORY_CARE_PROVIDER_SITE_OTHER): Payer: 59 | Admitting: Primary Care

## 2016-10-30 VITALS — BP 142/94 | HR 98 | Temp 98.1°F | Ht 66.0 in | Wt 204.1 lb

## 2016-10-30 DIAGNOSIS — R030 Elevated blood-pressure reading, without diagnosis of hypertension: Secondary | ICD-10-CM | POA: Diagnosis not present

## 2016-10-30 DIAGNOSIS — Z1231 Encounter for screening mammogram for malignant neoplasm of breast: Secondary | ICD-10-CM

## 2016-10-30 DIAGNOSIS — M545 Low back pain, unspecified: Secondary | ICD-10-CM

## 2016-10-30 DIAGNOSIS — E215 Disorder of parathyroid gland, unspecified: Secondary | ICD-10-CM | POA: Diagnosis not present

## 2016-10-30 DIAGNOSIS — G8929 Other chronic pain: Secondary | ICD-10-CM | POA: Diagnosis not present

## 2016-10-30 DIAGNOSIS — Z0000 Encounter for general adult medical examination without abnormal findings: Secondary | ICD-10-CM

## 2016-10-30 DIAGNOSIS — K219 Gastro-esophageal reflux disease without esophagitis: Secondary | ICD-10-CM | POA: Diagnosis not present

## 2016-10-30 DIAGNOSIS — Z1239 Encounter for other screening for malignant neoplasm of breast: Secondary | ICD-10-CM

## 2016-10-30 DIAGNOSIS — G44229 Chronic tension-type headache, not intractable: Secondary | ICD-10-CM | POA: Diagnosis not present

## 2016-10-30 DIAGNOSIS — F3342 Major depressive disorder, recurrent, in full remission: Secondary | ICD-10-CM | POA: Diagnosis not present

## 2016-10-30 MED ORDER — RANITIDINE HCL 150 MG PO TABS: ORAL_TABLET | ORAL | 0 refills | Status: DC

## 2016-10-30 NOTE — Assessment & Plan Note (Signed)
Doing well, no recent migraine.  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

## 2016-10-30 NOTE — Assessment & Plan Note (Signed)
Td UTD, declines influenza vaccination. Pap UTD. Mammogram due, pending. Discussed the importance of a healthy diet and regular exercise in order for weight loss, and to reduce the risk of other medical problems. Exam unremarkable. Labs pending. Follow up in 1 year.

## 2016-10-30 NOTE — Assessment & Plan Note (Signed)
Increased symptoms. Taking omeprazole 20 mg once every three days. Will have her try Zantac BID x 4-6 weeks.

## 2016-10-30 NOTE — Assessment & Plan Note (Signed)
Doing home exercises, not using muscle relaxer. Overall feels well managed.

## 2016-10-30 NOTE — Addendum Note (Signed)
Addended by: Ellamae Sia on: 10/30/2016 10:57 AM   Modules accepted: Orders

## 2016-10-30 NOTE — Assessment & Plan Note (Signed)
Denies concerns for anxiety and depression 

## 2016-10-30 NOTE — Patient Instructions (Signed)
Complete lab work prior to leaving today. I will notify you of your results once received.   Start ranitidine (Zantac) 150 mg tablets. Take 1 tablet by mouth once to twice daily for acid reflux. Please update me if no improvement.  Start exercising. You should be getting 150 minutes of moderate intensity exercise weekly.  Increase vegetables, fruit, whole grains.  Ensure you are consuming 64 ounces of water daily.  Call the [facility] to schedule your mammogram.  Check your blood pressure daily, around the same time of day, for the next 2 weeks.  Ensure that you have rested for 30 minutes prior to checking your blood pressure. Record your readings and notify me if you get readings at or above 135/90.  Follow up in 1 year for your annual exam or sooner if needed.  It was a pleasure to see you today!

## 2016-10-30 NOTE — Progress Notes (Signed)
Subjective:    Patient ID: Maureen Aguilar, female    DOB: 11-24-1975, 41 y.o.   MRN: [id number]  HPI  Ms. Maureen Aguilar is a 41 year old female who presents today for complete physical.  Blood pressure of 142/94 today, also 140/86 during the health screening at work one month ago. She's not checked her blood pressure since her health screen.   Immunizations: -Tetanus: Completed in 2016 -Influenza: Declines   Diet: She endorses a fair diet. Breakfast: Skips Lunch: Chicken, vegetables, pork, potatoes Dinner: Skips Snacks: Fruit, granola bars Desserts: None Beverages: Diet Green Tea, little water, occasional juice   Exercise: She does not currently exercise. Eye exam: Completed in 2018 Dental exam: Completes semi-annually Pap Smear: Up to date. Mammogram: Completed several years ago.   Review of Systems  Constitutional: Negative for unexpected weight change.  HENT: Negative for rhinorrhea.   Respiratory: Negative for cough and shortness of breath.   Cardiovascular: Negative for chest pain.  Gastrointestinal: Negative for constipation and diarrhea.       Esophageal burning, reflux.   Genitourinary: Negative for difficulty urinating and menstrual problem.  Musculoskeletal: Negative for arthralgias and myalgias.  Skin: Negative for rash.  Allergic/Immunologic: Negative for environmental allergies.  Neurological: Negative for dizziness, numbness and headaches.  Psychiatric/Behavioral:       Denies concerns for anxiety and depression       Past Medical History:  Diagnosis Date  . Depression   . Endometriosis   . Frequent headaches   . GERD (gastroesophageal reflux disease)   . History of kidney stones   . History of UTI   . Parathyroid abnormality [facility])      Social History   Social History  . Marital status: Married    Spouse name: N/A  . Number of children: N/A  . Years of education: N/A   Occupational History  . Not on file.   Social History Main Topics  .  Smoking status: Never Smoker  . Smokeless tobacco: Never Used  . Alcohol use No  . Drug use: Unknown  . Sexual activity: Not on file   Other Topics Concern  . Not on file   Social History Narrative   Single.   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   Highest level of education 12th grade.   Enjoys reading, driving.    Past Surgical History:  Procedure Laterality Date  . 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 EXCISION OF ENDOMETRIOMA  2014  . LITHOTRIPSY  2014  . PARATHYROIDECTOMY  2014   removal of one.    Family History  Problem Relation Age of Onset  . Alcohol abuse Mother   . Arthritis Mother   . Mental illness Mother   . Mental illness Brother        bipolar  . Cancer Maternal Aunt        breast  . Mental illness Maternal Grandmother   . Asthma Maternal Grandmother   . Hyperlipidemia Maternal Grandmother   . Hypertension Maternal Grandmother   . Heart disease Maternal Grandfather     No Known Allergies  Current Outpatient Prescriptions on File Prior to Visit  Medication Sig Dispense Refill  . Norethindrone Acetate-Ethinyl Estrad-FE (LOESTRIN 24 FE) 1-20 MG-MCG(24) tablet Take 1 pill daily  3  . SUMAtriptan (IMITREX) 50 MG tablet Take 1 tablet at migraine onset. May repeat in 2 hours if no resolve. 10 tablet  0  . tiZANidine (ZANAFLEX) 2 MG tablet TAKE 1 TABLET(2 MG) BY MOUTH EVERY 8 HOURS AS NEEDED FOR MUSCLE SPASMS 30 tablet 1   No current facility-administered medications on file prior to visit.     BP (!) 142/94   Pulse 98   Temp 98.1 F (36.7 C) (Oral)   Ht 5\' 6"  (1.676 m)   Wt 204 lb 1.9 oz (92.6 kg)   LMP 10/16/2016   SpO2 99%   BMI 32.95 kg/m    Objective:   Physical Exam  Constitutional: She is oriented to person, place, and time. She appears well-nourished.  HENT:  Right Ear: Tympanic membrane and ear canal normal.  Left Ear: Tympanic membrane and ear canal normal.  Nose: Nose normal.    Mouth/Throat: Oropharynx is clear and moist.  Eyes: Pupils are equal, round, and reactive to light. Conjunctivae and EOM are normal.  Neck: Neck supple. No thyromegaly present.  Cardiovascular: Normal rate and regular rhythm.   No murmur heard. Pulmonary/Chest: Effort normal and breath sounds normal. She has no rales.  Abdominal: Soft. Bowel sounds are normal. There is no tenderness.  Musculoskeletal: Normal range of motion.  Lymphadenopathy:    She has no cervical adenopathy.  Neurological: She is alert and oriented to person, place, and time. She has normal reflexes. No cranial nerve deficit.  Skin: Skin is warm and dry. No rash noted.  Psychiatric: She has a normal mood and affect.          Assessment & Plan:

## 2016-10-30 NOTE — Assessment & Plan Note (Signed)
Above goal today, also at health screening one month ago. Will have her work on diet and exercise, monitor BP and report readings at or above 135/90.

## 2016-10-31 NOTE — Addendum Note (Signed)
Addended by: Ellamae Sia on: 10/31/2016 10:19 AM   Modules accepted: Orders

## 2016-10-31 NOTE — Addendum Note (Signed)
Addended by: Ellamae Sia on: 10/31/2016 10:20 AM   Modules accepted: Orders

## 2016-10-31 NOTE — Addendum Note (Signed)
Addended by: Ellamae Sia on: 10/31/2016 03:40 PM   Modules accepted: Orders

## 2016-11-01 LAB — PARATHYROID HORMONE, INTACT (NO CA): PTH: 31 pg/mL (ref 15–65)

## 2016-11-04 LAB — COMPREHENSIVE METABOLIC PANEL
A/G RATIO: 1.5 (ref 1.2–2.2)
ALBUMIN: 4.3 g/dL (ref 3.5–5.5)
ALT: 7 IU/L (ref 0–32)
AST: 9 IU/L (ref 0–40)
Alkaline Phosphatase: 56 IU/L (ref 39–117)
BILIRUBIN TOTAL: 0.4 mg/dL (ref 0.0–1.2)
BUN/Creatinine Ratio: 12 (ref 9–23)
BUN: 12 mg/dL (ref 6–24)
CALCIUM: 10.1 mg/dL (ref 8.7–10.2)
CO2: 25 mmol/L (ref 20–29)
Chloride: 102 mmol/L (ref 96–106)
Creatinine, Ser: 0.99 mg/dL (ref 0.57–1.00)
GFR, EST AFRICAN AMERICAN: 81 mL/min/{1.73_m2} (ref >59)
GFR, EST NON AFRICAN AMERICAN: 70 mL/min/{1.73_m2} (ref >59)
GLOBULIN, TOTAL: 2.8 g/dL (ref 1.5–4.5)
Glucose: 81 mg/dL (ref 65–99)
POTASSIUM: 5 mmol/L (ref 3.5–5.2)
Sodium: 142 mmol/L (ref 134–144)
TOTAL PROTEIN: 7.1 g/dL (ref 6.0–8.5)

## 2016-11-04 LAB — HEMOGLOBIN A1C
Est. average glucose Bld gHb Est-mCnc: 108 mg/dL
HEMOGLOBIN A1C: 5.4 % (ref 4.8–5.6)

## 2016-11-04 LAB — LIPID PANEL
CHOL/HDL RATIO: 3.7 ratio (ref 0.0–4.4)
Cholesterol, Total: 182 mg/dL (ref 100–199)
HDL: 49 mg/dL (ref >39)
LDL Calculated: 77 mg/dL (ref 0–99)
Triglycerides: 278 mg/dL — ABNORMAL HIGH (ref 0–149)
VLDL Cholesterol Cal: 56 mg/dL — ABNORMAL HIGH (ref 5–40)

## 2016-11-04 LAB — PARATHYROID HORMONE, INTACT (NO CA)

## 2016-11-04 LAB — TSH: TSH: 2.01 u[IU]/mL (ref 0.450–4.500)

## 2016-12-08 ENCOUNTER — Ambulatory Visit
Admission: RE | Admit: 2016-12-08 | Discharge: 2016-12-08 | Disposition: A | Discharge status: Home / Self Care | Payer: 59 | Source: Ambulatory Visit | Attending: Primary Care | Admitting: Primary Care

## 2016-12-08 DIAGNOSIS — Z1239 Encounter for other screening for malignant neoplasm of breast: Secondary | ICD-10-CM

## 2016-12-08 DIAGNOSIS — Z1231 Encounter for screening mammogram for malignant neoplasm of breast: Secondary | ICD-10-CM | POA: Diagnosis not present

## 2016-12-16 ENCOUNTER — Other Ambulatory Visit: Payer: Self-pay | Admitting: *Deleted

## 2016-12-16 ENCOUNTER — Inpatient Hospital Stay
Admission: RE | Admit: 2016-12-16 | Discharge: 2016-12-16 | Disposition: A | Discharge status: Home / Self Care | Payer: Self-pay | Source: Ambulatory Visit | Attending: *Deleted | Admitting: *Deleted

## 2016-12-16 DIAGNOSIS — Z9289 Personal history of other medical treatment: Secondary | ICD-10-CM

## 2017-04-27 ENCOUNTER — Ambulatory Visit: Payer: Self-pay

## 2017-04-27 NOTE — Telephone Encounter (Signed)
Patient called in with c/o "rash." She says "I woke up around 0200 Sunday with a fever. I thought I had the flu, so I have been taking some OTC Cold and Flu yesterday. Today after I showered, I noticed a rash to my chest, legs and arms. It's red, some are raised, but others are flat. They don't itch, but when I touch them they are painful. They are the size of a tip of an ink pen, not large at all. I'm not running a fever now, but I did take the cold and flu today." I asked about other symptoms, she says "abdominal pain, my wrist hurts, I have vertigo, but this is not new." According to protocol, see PCP within 3 days, appointment scheduled for tomorrow at 0930 with Allie Bossier, NP, care advise given, patient verbalized understanding.   Reason for Disposition . Mild widespread rash  Answer Assessment - Initial Assessment Questions 1. APPEARANCE of RASH: "Describe the rash." (e.g., spots, blisters, raised areas, skin peeling, scaly)     Some are raised, painful 2. SIZE: "How big are the spots?" (e.g., tip of pen, eraser, coin; inches, centimeters)     Almost like the tip of an ink pen 3. LOCATION: "Where is the rash located?"     Chest, arms, legs 4. COLOR: "What color is the rash?" (Note: It is difficult to assess rash color in people with darker-colored skin. When this situation occurs, simply ask the caller to describe what they see.)     Red 5. ONSET: "When did the rash begin?"     This morning after shower about 2 hours ago 6. FEVER: "Do you have a fever?" If so, ask: "What is your temperature, how was it measured, and when did it start?"     No 7. ITCHING: "Does the rash itch?" If so, ask: "How bad is the itch?" (Scale 1-10; or mild, moderate, severe)     No, but painful 8. CAUSE: "What do you think is causing the rash?"     I don't know 9. MEDICATION FACTORS: "Have you started any new medications within the last 2 weeks?" (e.g., antibiotics)      OTC cold and flu 10. OTHER SYMPTOMS: "Do  you have any other symptoms?" (e.g., dizziness, headache, sore throat, joint pain)       Joint pain, abdomen hurts 11. PREGNANCY: "Is there any chance you are pregnant?" "When was your last menstrual period?"       No  Protocols used: RASH OR REDNESS - [facility]

## 2017-04-28 ENCOUNTER — Ambulatory Visit (INDEPENDENT_AMBULATORY_CARE_PROVIDER_SITE_OTHER): Payer: Managed Care, Other (non HMO) | Admitting: Primary Care

## 2017-04-28 ENCOUNTER — Encounter: Payer: Self-pay | Admitting: Primary Care

## 2017-04-28 VITALS — BP 130/78 | HR 107 | Temp 101.2°F | Ht 66.0 in | Wt 194.5 lb

## 2017-04-28 DIAGNOSIS — R21 Rash and other nonspecific skin eruption: Secondary | ICD-10-CM | POA: Insufficient documentation

## 2017-04-28 MED ORDER — DOXYCYCLINE HYCLATE 100 MG PO TABS: 100.0000 mg | ORAL_TABLET | Freq: Two times a day (BID) | ORAL | 0 refills | Status: DC

## 2017-04-28 NOTE — Patient Instructions (Signed)
Start Doxycycline antibiotic for the infection. Take 1 tablet by mouth twice daily for 10 days.  Stop by the lab prior to leaving today. I will notify you of your results once received.   It was a pleasure to see you today!

## 2017-04-28 NOTE — Progress Notes (Signed)
Subjective:    Patient ID: Maureen Aguilar, female    DOB: November 18, 1975, 42 y.o.   MRN: [id number]  HPI  Maureen Aguilar is a 42 year old female who presents today with a chief complaint of rash.   Symptoms began with chills and feeling feverish 2 days ago. Later during the day she noticed generalized abdominal aches "felt like someone punched me", bilateral wrist and elbow aches. She has a history of endometriosis and is following with GYN, she's not sure if the abdominal achy is from her chronic endometriosis.   Yesterday she noticed circular, raised, red spots to her bilateral upper extremities, anterior chest, bilateral lower extremities, back; and ulcers to the upper gum line of her mouth. The spots are not itchy, but are tender when touched.   She denies changes in soaps, detergents, medications. No one else in her house has these symptoms. She denies seeing a tick on her skin, she denies recent yard work.  She's not been exposed to a child with viral illness nor has she been around young children.  Review of Systems  Constitutional: Positive for chills and fatigue.  HENT: Negative for congestion.   Respiratory: Negative for cough.   Gastrointestinal:       Abdominal cramping  Musculoskeletal: Positive for arthralgias.  Skin: Positive for rash.       Past Medical History:  Diagnosis Date  . Depression   . Endometriosis   . Frequent headaches   . GERD (gastroesophageal reflux disease)   . History of kidney stones   . History of UTI   . 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 Marital status: Married    Spouse name: Not on file  . Number of children: Not on file  . Years of education: Not on file  . Highest education level: Not on file  Occupational History  . Not on file  Social Needs  . Financial resource strain: Not on file  . Food insecurity:    Worry: Not on file    Inability: Not on file  . Transportation needs:    Medical:  Not on file    Non-medical: Not on file  Tobacco Use  . Smoking status: Never Smoker  . Smokeless tobacco: Never Used  Substance and Sexual Activity  . Alcohol use: No    Alcohol/week: 0.0 oz  . Drug use: Not on file  . Sexual activity: Not on file  Lifestyle  . Physical activity:    Days per week: Not on file    Minutes per session: Not on file  . Stress: Not on file  Relationships  . Social connections:    Talks on phone: Not on file    Gets together: Not on file    Attends religious service: Not on file    Active member of club or organization: Not on file    Attends meetings of clubs or organizations: Not on file    Relationship status: Not on file  . Intimate partner violence:    Fear of current or ex partner: Not on file    Emotionally abused: Not on file    Physically abused: Not on file    Forced sexual activity: Not on file  Other Topics Concern  . Not on file  Social History Narrative   Single.   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   Highest level of  education 12th grade.   Enjoys reading, driving.    Past Surgical History:  Procedure Laterality Date  . 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 EXCISION OF ENDOMETRIOMA  2014  . LITHOTRIPSY  2014  . PARATHYROIDECTOMY  2014   removal of one.    Family History  Problem Relation Age of Onset  . Alcohol abuse Mother   . Arthritis Mother   . Mental illness Mother   . Mental illness Brother        bipolar  . Cancer Maternal Aunt        breast  . Breast cancer Maternal Aunt 29  . Mental illness Maternal Grandmother   . Asthma Maternal Grandmother   . Hyperlipidemia Maternal Grandmother   . Hypertension Maternal Grandmother   . Heart disease Maternal Grandfather     No Known Allergies  Current Outpatient Medications on File Prior to Visit  Medication Sig Dispense Refill  . Norethindrone Acetate-Ethinyl Estrad-FE (LOESTRIN 24 FE) 1-20 MG-MCG(24) tablet Take 1  pill daily  3  . ranitidine (ZANTAC) 150 MG tablet Take 1 tablet by mouth once to twice daily for acid reflux. 180 tablet 0  . SUMAtriptan (IMITREX) 50 MG tablet Take 1 tablet at migraine onset. May repeat in 2 hours if no resolve. 10 tablet 0  . tiZANidine (ZANAFLEX) 2 MG tablet TAKE 1 TABLET(2 MG) BY MOUTH EVERY 8 HOURS AS NEEDED FOR MUSCLE SPASMS 30 tablet 1   No current facility-administered medications on file prior to visit.     BP 130/78   Pulse (!) 107   Temp (!) 101.2 F (38.4 C) (Oral)   Ht 5\' 6"  (1.676 m)   Wt 194 lb 8 oz (88.2 kg)   LMP 04/23/2017   SpO2 98%   BMI 31.39 kg/m    Objective:   Physical Exam  Constitutional: She appears well-nourished.  Neck: Neck supple.  Cardiovascular: Normal rate and regular rhythm.  Pulmonary/Chest: Effort normal and breath sounds normal.  Skin: Skin is warm and dry. Rash noted.  Numerous raised, red, circular 3-5 mm bumps to bilateral lower extremities, bilateral upper extremities, anterior and posterior trunk. No rash to hands, face, feet. Oral ulcers to inner upper gum line x 2 on left side.          Assessment & Plan:

## 2017-04-28 NOTE — Assessment & Plan Note (Signed)
Suddenly present x 24 hours, also with flu like/viral symptoms.   Symptoms most concerning for rocky mountain spotted fevers, will rule out but treat empirically with Doxycycline course. Labs pending.  Also consider hand, foot, mouth disease or allergic dermatitis as potential cause. Doesn't seem to be bed bugs, flea bites, poison ivy/oak.    Labs pending. Treatment initiated.

## 2017-04-30 ENCOUNTER — Encounter: Payer: Self-pay | Admitting: Primary Care

## 2017-04-30 DIAGNOSIS — K121 Other forms of stomatitis: Secondary | ICD-10-CM

## 2017-04-30 LAB — COMPREHENSIVE METABOLIC PANEL
ALBUMIN: 4 g/dL (ref 3.5–5.5)
ALT: 7 IU/L (ref 0–32)
AST: 14 IU/L (ref 0–40)
Albumin/Globulin Ratio: 1.5 (ref 1.2–2.2)
Alkaline Phosphatase: 72 IU/L (ref 39–117)
BILIRUBIN TOTAL: 0.3 mg/dL (ref 0.0–1.2)
BUN/Creatinine Ratio: 12 (ref 9–23)
BUN: 11 mg/dL (ref 6–24)
CALCIUM: 9.2 mg/dL (ref 8.7–10.2)
CO2: 22 mmol/L (ref 20–29)
CREATININE: 0.95 mg/dL (ref 0.57–1.00)
Chloride: 100 mmol/L (ref 96–106)
GFR, EST AFRICAN AMERICAN: 85 mL/min/{1.73_m2} (ref >59)
GFR, EST NON AFRICAN AMERICAN: 74 mL/min/{1.73_m2} (ref >59)
GLUCOSE: 78 mg/dL (ref 65–99)
Globulin, Total: 2.7 g/dL (ref 1.5–4.5)
Potassium: 4.4 mmol/L (ref 3.5–5.2)
Sodium: 139 mmol/L (ref 134–144)
TOTAL PROTEIN: 6.7 g/dL (ref 6.0–8.5)

## 2017-04-30 LAB — ROCKY MTN SPOTTED FVR ABS PNL(IGG+IGM)
RMSF IgG: NEGATIVE
RMSF IgM: 0.24 index (ref 0.00–0.89)

## 2017-04-30 LAB — CBC WITH DIFFERENTIAL/PLATELET
Basophils Absolute: 0 10*3/uL (ref 0.0–0.2)
Basos: 0 %
EOS (ABSOLUTE): 0 10*3/uL (ref 0.0–0.4)
Eos: 0 %
HEMOGLOBIN: 13.8 g/dL (ref 11.1–15.9)
Hematocrit: 40 % (ref 34.0–46.6)
IMMATURE GRANS (ABS): 0 10*3/uL (ref 0.0–0.1)
IMMATURE GRANULOCYTES: 0 %
LYMPHS: 10 %
Lymphocytes Absolute: 1.2 10*3/uL (ref 0.7–3.1)
MCH: 30.2 pg (ref 26.6–33.0)
MCHC: 34.5 g/dL (ref 31.5–35.7)
MCV: 88 fL (ref 79–97)
MONOCYTES: 8 %
Monocytes Absolute: 0.9 10*3/uL (ref 0.1–0.9)
NEUTROS ABS: 9.5 10*3/uL — AB (ref 1.4–7.0)
NEUTROS PCT: 82 %
PLATELETS: 261 10*3/uL (ref 150–379)
RBC: 4.57 x10E6/uL (ref 3.77–5.28)
RDW: 13.3 % (ref 12.3–15.4)
WBC: 11.7 10*3/uL — ABNORMAL HIGH (ref 3.4–10.8)

## 2017-04-30 MED ORDER — LIDOCAINE VISCOUS 2 % MT SOLN: OROMUCOSAL | 0 refills | Status: DC

## 2017-04-30 MED ORDER — TRIAMCINOLONE ACETONIDE 0.1 % MT PSTE: PASTE | OROMUCOSAL | 0 refills | Status: DC

## 2017-05-01 ENCOUNTER — Encounter: Payer: Self-pay | Admitting: Primary Care

## 2017-05-04 LAB — HM PAP SMEAR: HM Pap smear: NEGATIVE

## 2017-05-05 ENCOUNTER — Ambulatory Visit: Payer: Managed Care, Other (non HMO) | Admitting: Primary Care

## 2017-05-05 ENCOUNTER — Encounter: Payer: Self-pay | Admitting: Primary Care

## 2017-05-05 VITALS — BP 134/94 | HR 84 | Temp 98.4°F | Ht 66.0 in | Wt 192.5 lb

## 2017-05-05 DIAGNOSIS — R21 Rash and other nonspecific skin eruption: Secondary | ICD-10-CM

## 2017-05-05 DIAGNOSIS — R030 Elevated blood-pressure reading, without diagnosis of hypertension: Secondary | ICD-10-CM | POA: Diagnosis not present

## 2017-05-05 NOTE — Assessment & Plan Note (Signed)
RMSF work up was negative which is reassuring. Today her rash does seem improved when compared to last week. Do suspect hand, foot, mouth disease for which treatment is symptom management.  Continue triamcinolone paste, viscous lidocaine. Repeat CBC today.

## 2017-05-05 NOTE — Addendum Note (Signed)
Addended by: Ellamae Sia on: 05/05/2017 09:13 AM   Modules accepted: Orders

## 2017-05-05 NOTE — Patient Instructions (Signed)
Start monitoring your blood pressure daily, around the same time of day, for the next several weeks.  Ensure that you have rested for 30 minutes prior to checking your blood pressure. Record your readings and bring them to your next visit.  Continue to use the triamcinolone paste and viscous lidocaine.  Stop by the lab prior to leaving today. I will notify you of your results once received.   Please schedule a follow up appointment in 1 month for blood pressure check.  It was a pleasure to see you today!   Hand, Foot, and Mouth Disease, Adult Hand, foot, and mouth disease is a common viral illness. It happens mainly in children who are younger than 10 years old, but adolescents and adults may also get it. The illness often causes:  Sore throat.  Sores in the mouth.  Fever.  Rash on the hands and feet.  Usually, this condition is not serious. Most people get better within 1-2 weeks. What are the causes? This condition is usually caused by a group of viruses called enteroviruses. The disease can spread from person to person (is contagious). A person is most contagious during the first week of the illness. The infection spreads through direct contact with:  Nose discharge of an infected person.  Throat discharge of an infected person.  Stool (feces) of an infected person.  What are the signs or symptoms? Symptoms of this condition include:  Small sores in the mouth. These may cause pain.  A rash on the hands and feet, and sometimes on the buttocks. The rash may also occur on the arms, legs, or other areas of the body. The rash may look like small red bumps or sores and may have blisters.  Fever.  Body aches or headaches.  Irritability.  Decreased appetite.  How is this diagnosed? This condition can usually be diagnosed with a physical exam in which your health care provider will look at your rash and mouth sores. Tests are usually not needed. In some cases, a stool  (feces) sample or a throat swab may be taken to check for the virus or for other infections. How is this treated? In most cases, no treatment is needed. People usually get better within 2 weeks without treatment. To help relieve pain or fever, your health care provider may recommend over-the-counter medicines such as ibuprofen or acetaminophen. To help relieve discomfort from mouth sores, your health care provider may recommend using:  Solutions that are rinsed in the mouth.  Pain-relieving gel that is applied to the sores (topical gel).  Antacid medicine.  Follow these instructions at home: Managing pain and discomfort  Rinse your mouth with a salt-water mixture 3-4 times a day or as needed. To make a salt-water mixture, completely dissolve -1 tsp of salt in 1 cup of warm water. This can help to reduce pain from the mouth sores. Your health care provider may also recommend other rinse solutions to treat mouth sores.  To relieve discomfort when you are eating: ? Try combinations of foods to see what you can tolerate. Aim for a balanced diet. ? Eat soft foods. These may be easier to swallow. ? Avoid foods and drinks that are salty, spicy, or acidic. ? Avoid alcohol. ? Try cold food and drinks, such as water, milk, milkshakes, frozen ice pops, slushies, and sherbets. Low-calorie sport drinks are good choices for staying hydrated. General instructions  Return to your normal activities as told by your health care provider. Ask your health  care provider what activities are safe for you.  Take or apply over-the-counter and prescription medicines only as told by your health care provider.  Wash your hands often with soap and water. If soap and water are not available, use hand sanitizer.  Stay away from work, schools, or other group settings during the first few days of the illness, or until your fever is gone.  Keep all follow-up visits as told by your health care provider. This is  important. Contact a health care provider if:  Your symptoms get worse or do not improve within 2 weeks.  You have pain that does not get better with medicine.  You feel very irritable.  You have trouble swallowing.  You develop sores or blisters on your lips or outside of your mouth.  You have a fever for more than 3 days. Get help right away if:  You develop signs of severe dehydration, such as: ? Decreased urination. This means urinating only very small amounts or urinating fewer than 3 times in a 24-hour period. ? Urine that is very dark. ? Dry mouth, tongue, or lips. ? Decreased tears or sunken eyes. ? Dry skin. ? Rapid breathing. ? Decreased activity or being very sleepy. ? Pale skin. ? Fingertips taking longer than 2 seconds to turn pink after a gentle squeeze. ? Weight loss.  You have a severe headache.  You have a stiff neck.  You experience changes in your behavior.  You have chest pain.  You have trouble breathing. Summary  Hand, foot, and mouth disease is a common viral illness.  This disease can spread from person to person (is contagious).  The illness often causes a sore throat, sores in the mouth, fever, and a rash on the hands and feet.  Typically, no treatment is needed for this condition. People usually get better within 2 weeks without treatment.  Get help right away if you develop signs of severe dehydration. This information is not intended to replace advice given to you by your health care provider. Make sure you discuss any questions you have with your health care provider. Document Released: 02/18/2016 Document Revised: 02/18/2016 Document Reviewed: 02/18/2016 Elsevier Interactive Patient Education  2018 [employer].

## 2017-05-05 NOTE — Progress Notes (Signed)
Subjective:    Patient ID: Maureen Aguilar, female    DOB: 1976-01-12, 42 y.o.   MRN: [id number]  HPI  Maureen Aguilar is a 42 year old female who presents today with a chief complaint of elevated blood pressure readings.  She was last evaluated one week ago with complaints of rash with red spots to most of her body (extremities and trunk, oral ulcers). She was treated with Doxycycline given suspicion for RMSF. Two days later she started feeling worse on Doxycycline her Doxycycline was stopped. Her CBC did show mild elevation in WBC, RMSF test was negative.  Since her last visit she still continues to notice the rash and oral ulcers. She's using the viscous lidocaine and oral paste with mild improvement. She went to her gynecologist yesterday who noted a BP reading of 174/103 which is why she's here today. She has checked her BP at home in the past and thinks it's been around 130/80's.  BP Readings from Last 3 Encounters:  05/05/17 (!) 134/94  04/28/17 130/78  10/30/16 (!) 142/94   Over one year ago she's been working on limiting salt in her diet. She's been working on walking. She's still not feeling well today as she continues to struggle with pain from her rash and oral ulcers. She denies chest pain, dizziness, headaches. Does have a history of vertigo and has been experiencing those symptoms intermittently for the past 3 weeks.   Review of Systems  Constitutional: Positive for fatigue. Negative for fever.  HENT: Negative for congestion.        Oral ulcers  Respiratory: Negative for cough and shortness of breath.   Cardiovascular: Negative for chest pain.  Skin:       Painful rash.  Neurological: Negative for headaches.       Past Medical History:  Diagnosis Date  . Depression   . Endometriosis   . Frequent headaches   . GERD (gastroesophageal reflux disease)   . History of kidney stones   . History of UTI   . 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 Marital status: Married    Spouse name: Not on file  . Number of children: Not on file  . Years of education: Not on file  . Highest education level: Not on file  Occupational History  . Not on file  Social Needs  . Financial resource strain: Not on file  . Food insecurity:    Worry: Not on file    Inability: Not on file  . Transportation needs:    Medical: Not on file    Non-medical: Not on file  Tobacco Use  . Smoking status: Never Smoker  . Smokeless tobacco: Never Used  Substance and Sexual Activity  . Alcohol use: No    Alcohol/week: 0.0 oz  . Drug use: Not on file  . Sexual activity: Not on file  Lifestyle  . Physical activity:    Days per week: Not on file    Minutes per session: Not on file  . Stress: Not on file  Relationships  . Social connections:    Talks on phone: Not on file    Gets together: Not on file    Attends religious service: Not on file    Active member of club or organization: Not on file    Attends meetings of clubs or organizations: Not on file    Relationship status: Not on file  . Intimate  partner violence:    Fear of current or ex partner: Not on file    Emotionally abused: Not on file    Physically abused: Not on file    Forced sexual activity: Not on file  Other Topics Concern  . Not on file  Social History Narrative   Single.   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   Highest level of education 12th grade.   Enjoys reading, driving.    Past Surgical History:  Procedure Laterality Date  . 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 EXCISION OF ENDOMETRIOMA  2014  . LITHOTRIPSY  2014  . PARATHYROIDECTOMY  2014   removal of one.    Family History  Problem Relation Age of Onset  . Alcohol abuse Mother   . Arthritis Mother   . Mental illness Mother   . Mental illness Brother        bipolar  . Cancer Maternal Aunt        breast  . Breast cancer Maternal Aunt  29  . Mental illness Maternal Grandmother   . Asthma Maternal Grandmother   . Hyperlipidemia Maternal Grandmother   . Hypertension Maternal Grandmother   . Heart disease Maternal Grandfather     No Known Allergies  Current Outpatient Medications on File Prior to Visit  Medication Sig Dispense Refill  . lidocaine (XYLOCAINE) 2 % solution Swish and spit 10 ml into the mouth three times daily as needed for mouth pain. 100 mL 0  . Norethindrone Acetate-Ethinyl Estrad-FE (LOESTRIN 24 FE) 1-20 MG-MCG(24) tablet Take 1 pill daily  3  . ranitidine (ZANTAC) 150 MG tablet Take 1 tablet by mouth once to twice daily for acid reflux. 180 tablet 0  . SUMAtriptan (IMITREX) 50 MG tablet Take 1 tablet at migraine onset. May repeat in 2 hours if no resolve. 10 tablet 0  . tiZANidine (ZANAFLEX) 2 MG tablet TAKE 1 TABLET(2 MG) BY MOUTH EVERY 8 HOURS AS NEEDED FOR MUSCLE SPASMS 30 tablet 1  . triamcinolone (KENALOG) 0.1 % paste Apply to sores in mouth twice daily. 5 g 0   No current facility-administered medications on file prior to visit.     BP (!) 134/94 (BP Location: Left Arm, Patient Position: Sitting, Cuff Size: Normal)   Pulse 84   Temp 98.4 F (36.9 C) (Oral)   Ht 5\' 6"  (1.676 m)   Wt 192 lb 8 oz (87.3 kg)   LMP 04/23/2017   SpO2 99%   BMI 31.07 kg/m    Objective:   Physical Exam  Constitutional: She appears well-nourished.  HENT:  Several oral ulcers noted to bilateral upper soft palate and right buccal wall.   Neck: Neck supple.  Cardiovascular: Normal rate and regular rhythm.  Pulmonary/Chest: Effort normal and breath sounds normal. She has no wheezes.  Skin: Skin is warm and dry.  Rash with darker to flesh red spots to upper and lower extremities. No involvement to hands or feet. Seems improved from last visit.          Assessment & Plan:

## 2017-05-05 NOTE — Assessment & Plan Note (Signed)
Elevated on several visits, home readings are normal. She is still struggling with painful rash now which could cause elevations.  Will have her continue to work on lifestyle changes, start monitoring BP more regularly at home. She will report readings at or above 135/90. Will have her return in 1 month for BP check.

## 2017-05-06 LAB — CBC WITH DIFFERENTIAL/PLATELET
BASOS: 0 %
Basophils Absolute: 0 10*3/uL (ref 0.0–0.2)
EOS (ABSOLUTE): 0.1 10*3/uL (ref 0.0–0.4)
EOS: 1 %
HEMATOCRIT: 37.8 % (ref 34.0–46.6)
Hemoglobin: 12.8 g/dL (ref 11.1–15.9)
IMMATURE GRANS (ABS): 0 10*3/uL (ref 0.0–0.1)
IMMATURE GRANULOCYTES: 0 %
Lymphocytes Absolute: 1.7 10*3/uL (ref 0.7–3.1)
Lymphs: 19 %
MCH: 29.6 pg (ref 26.6–33.0)
MCHC: 33.9 g/dL (ref 31.5–35.7)
MCV: 87 fL (ref 79–97)
MONOS ABS: 0.7 10*3/uL (ref 0.1–0.9)
Monocytes: 8 %
NEUTROS PCT: 72 %
Neutrophils Absolute: 6.8 10*3/uL (ref 1.4–7.0)
Platelets: 370 10*3/uL (ref 150–379)
RBC: 4.33 x10E6/uL (ref 3.77–5.28)
RDW: 13.2 % (ref 12.3–15.4)
WBC: 9.4 10*3/uL (ref 3.4–10.8)

## 2017-06-04 ENCOUNTER — Ambulatory Visit: Payer: Managed Care, Other (non HMO) | Admitting: Primary Care

## 2017-06-04 ENCOUNTER — Encounter: Payer: Self-pay | Admitting: Primary Care

## 2017-06-04 VITALS — BP 134/86 | HR 77 | Temp 98.4°F | Ht 66.0 in | Wt 194.8 lb

## 2017-06-04 DIAGNOSIS — R030 Elevated blood-pressure reading, without diagnosis of hypertension: Secondary | ICD-10-CM

## 2017-06-04 DIAGNOSIS — G4700 Insomnia, unspecified: Secondary | ICD-10-CM | POA: Diagnosis not present

## 2017-06-04 MED ORDER — TRAZODONE HCL 50 MG PO TABS: 50.0000 mg | ORAL_TABLET | Freq: Every evening | ORAL | 3 refills | Status: DC | PRN

## 2017-06-04 NOTE — Progress Notes (Signed)
Subjective:    Patient ID: Maureen Aguilar, female    DOB: Jun 24, 1975, 42 y.o.   MRN: [id number]  HPI  Maureen Aguilar is a 42 year female who presents today for follow up of elevated blood pressure readings.   BP Readings from Last 3 Encounters:  06/04/17 134/86  05/05/17 (!) 134/94  04/28/17 130/78   She has a history of elevated blood pressure readings on several office visits in the past. During her last visit she was asked to start monitoring BP at home.  Her home BP readings are mostly ranging 120's-130's/70's80's; one in the 409 range systolic, two in the 90 range diastolic. She has one BP reading of 153/96, she was upset during that reading.   She does struggle with insomnia which has been problematic for 20+ years, she thinks this may be contributing to her elevated readings. She has difficulty falling and staying asleep. She works third shift at work now (11 pm to 7:30 am) but still struggled during day shift. She's tried Ambien and Temazepam in the past which caused increased symptoms of insomnia and/or irritability. She's currently taking Melatonin and will wake up every hour. She denies feeling anxious, nevous. She denies daily worry, panic attacks.  Review of Systems  Eyes: Negative for visual disturbance.  Respiratory: Negative for shortness of breath.   Cardiovascular: Negative for chest pain.  Psychiatric/Behavioral: Positive for sleep disturbance. The patient is not nervous/anxious.        Past Medical History:  Diagnosis Date  . Depression   . Endometriosis   . Frequent headaches   . GERD (gastroesophageal reflux disease)   . History of kidney stones   . History of UTI   . 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 Marital status: Married    Spouse name: Not on file  . Number of children: Not on file  . Years of education: Not on file  . Highest education level: Not on file  Occupational History  . Not on file  Social  Needs  . Financial resource strain: Not on file  . Food insecurity:    Worry: Not on file    Inability: Not on file  . Transportation needs:    Medical: Not on file    Non-medical: Not on file  Tobacco Use  . Smoking status: Never Smoker  . Smokeless tobacco: Never Used  Substance and Sexual Activity  . Alcohol use: No    Alcohol/week: 0.0 oz  . Drug use: Not on file  . Sexual activity: Not on file  Lifestyle  . Physical activity:    Days per week: Not on file    Minutes per session: Not on file  . Stress: Not on file  Relationships  . Social connections:    Talks on phone: Not on file    Gets together: Not on file    Attends religious service: Not on file    Active member of club or organization: Not on file    Attends meetings of clubs or organizations: Not on file    Relationship status: Not on file  . Intimate partner violence:    Fear of current or ex partner: Not on file    Emotionally abused: Not on file    Physically abused: Not on file    Forced sexual activity: Not on file  Other Topics Concern  . Not on file  Social History Narrative   Single.  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   Highest level of education 12th grade.   Enjoys reading, driving.    Past Surgical History:  Procedure Laterality Date  . 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 EXCISION OF ENDOMETRIOMA  2014  . LITHOTRIPSY  2014  . PARATHYROIDECTOMY  2014   removal of one.    Family History  Problem Relation Age of Onset  . Alcohol abuse Mother   . Arthritis Mother   . Mental illness Mother   . Mental illness Brother        bipolar  . Cancer Maternal Aunt        breast  . Breast cancer Maternal Aunt 29  . Mental illness Maternal Grandmother   . Asthma Maternal Grandmother   . Hyperlipidemia Maternal Grandmother   . Hypertension Maternal Grandmother   . Heart disease Maternal Grandfather     No Known Allergies  Current Outpatient  Medications on File Prior to Visit  Medication Sig Dispense Refill  . lidocaine (XYLOCAINE) 2 % solution Swish and spit 10 ml into the mouth three times daily as needed for mouth pain. 100 mL 0  . Norethindrone Acetate-Ethinyl Estrad-FE (LOESTRIN 24 FE) 1-20 MG-MCG(24) tablet Take 1 pill daily  3  . ranitidine (ZANTAC) 150 MG tablet Take 1 tablet by mouth once to twice daily for acid reflux. 180 tablet 0  . SUMAtriptan (IMITREX) 50 MG tablet Take 1 tablet at migraine onset. May repeat in 2 hours if no resolve. 10 tablet 0  . tiZANidine (ZANAFLEX) 2 MG tablet TAKE 1 TABLET(2 MG) BY MOUTH EVERY 8 HOURS AS NEEDED FOR MUSCLE SPASMS 30 tablet 1  . triamcinolone (KENALOG) 0.1 % paste Apply to sores in mouth twice daily. 5 g 0   No current facility-administered medications on file prior to visit.     BP 134/86   Pulse 77   Temp 98.4 F (36.9 C) (Oral)   Ht 5\' 6"  (1.676 m)   Wt 194 lb 12 oz (88.3 kg)   LMP 06/01/2017   SpO2 99%   BMI 31.43 kg/m    Objective:   Physical Exam  Constitutional: She appears well-nourished.  Neck: Neck supple.  Cardiovascular: Normal rate and regular rhythm.  Respiratory: Effort normal and breath sounds normal.  Skin: Skin is warm and dry.  Psychiatric: She has a normal mood and affect.           Assessment & Plan:

## 2017-06-04 NOTE — Patient Instructions (Signed)
Continue to monitor your blood pressure, once weekly if possible. Please notify me of consistent readings at or above 135/90.  You should be getting 150 minutes of moderate intensity exercise weekly.  Start Trazodone 50 mg tablets for insomnia. Take 1 tablet by mouth 30 minutes prior to bedtime. Please send me an update via My Chart in 1-2 weeks.  It was a pleasure to see you today!   DASH Eating Plan DASH stands for "Dietary Approaches to Stop Hypertension." The DASH eating plan is a healthy eating plan that has been shown to reduce high blood pressure (hypertension). It may also reduce your risk for type 2 diabetes, heart disease, and stroke. The DASH eating plan may also help with weight loss. What are tips for following this plan? General guidelines  Avoid eating more than 2,300 mg (milligrams) of salt (sodium) a day. If you have hypertension, you may need to reduce your sodium intake to 1,500 mg a day.  Limit alcohol intake to no more than 1 drink a day for nonpregnant women and 2 drinks a day for men. One drink equals 12 oz of beer, 5 oz of wine, or 1 oz of hard liquor.  Work with your health care provider to maintain a healthy body weight or to lose weight. Ask what an ideal weight is for you.  Get at least 30 minutes of exercise that causes your heart to beat faster (aerobic exercise) most days of the week. Activities may include walking, swimming, or biking.  Work with your health care provider or diet and nutrition specialist (dietitian) to adjust your eating plan to your individual calorie needs. Reading food labels  Check food labels for the amount of sodium per serving. Choose foods with less than 5 percent of the Daily Value of sodium. Generally, foods with less than 300 mg of sodium per serving fit into this eating plan.  To find whole grains, look for the word "whole" as the first word in the ingredient list. Shopping  Buy products labeled as "low-sodium" or "no salt  added."  Buy fresh foods. Avoid canned foods and premade or frozen meals. Cooking  Avoid adding salt when cooking. Use salt-free seasonings or herbs instead of table salt or sea salt. Check with your health care provider or pharmacist before using salt substitutes.  Do not fry foods. Cook foods using healthy methods such as baking, boiling, grilling, and broiling instead.  Cook with heart-healthy oils, such as olive, canola, soybean, or sunflower oil. Meal planning   Eat a balanced diet that includes: ? 5 or more servings of fruits and vegetables each day. At each meal, try to fill half of your plate with fruits and vegetables. ? Up to 6-8 servings of whole grains each day. ? Less than 6 oz of lean meat, poultry, or fish each day. A 3-oz serving of meat is about the same size as a deck of cards. One egg equals 1 oz. ? 2 servings of low-fat dairy each day. ? A serving of nuts, seeds, or beans 5 times each week. ? Heart-healthy fats. Healthy fats called Omega-3 fatty acids are found in foods such as flaxseeds and coldwater fish, like sardines, salmon, and mackerel.  Limit how much you eat of the following: ? Canned or prepackaged foods. ? Food that is high in trans fat, such as fried foods. ? Food that is high in saturated fat, such as fatty meat. ? Sweets, desserts, sugary drinks, and other foods with added sugar. ?  Full-fat dairy products.  Do not salt foods before eating.  Try to eat at least 2 vegetarian meals each week.  Eat more home-cooked food and less restaurant, buffet, and fast food.  When eating at a restaurant, ask that your food be prepared with less salt or no salt, if possible. What foods are recommended? The items listed may not be a complete list. Talk with your dietitian about what dietary choices are best for you. Grains Whole-grain or whole-wheat bread. Whole-grain or whole-wheat pasta. Brown rice. [place]. Bulgur. Whole-grain and low-sodium cereals.  Pita bread. Low-fat, low-sodium crackers. Whole-wheat flour tortillas. Vegetables Fresh or frozen vegetables (raw, steamed, roasted, or grilled). Low-sodium or reduced-sodium tomato and vegetable juice. Low-sodium or reduced-sodium tomato sauce and tomato paste. Low-sodium or reduced-sodium canned vegetables. Fruits All fresh, dried, or frozen fruit. Canned fruit in natural juice (without added sugar). Meat and other protein foods Skinless chicken or [place]. Ground chicken or [place]. Pork with fat trimmed off. Fish and seafood. Egg whites. Dried beans, peas, or lentils. Unsalted nuts, nut butters, and seeds. Unsalted canned beans. Lean cuts of beef with fat trimmed off. Low-sodium, lean deli meat. Dairy Low-fat (1%) or fat-free (skim) milk. Fat-free, low-fat, or reduced-fat cheeses. Nonfat, low-sodium ricotta or cottage cheese. Low-fat or nonfat yogurt. Low-fat, low-sodium cheese. Fats and oils Soft margarine without trans fats. Vegetable oil. Low-fat, reduced-fat, or light mayonnaise and salad dressings (reduced-sodium). Canola, safflower, olive, soybean, and sunflower oils. Avocado. Seasoning and other foods Herbs. Spices. Seasoning mixes without salt. Unsalted popcorn and pretzels. Fat-free sweets. What foods are not recommended? The items listed may not be a complete list. Talk with your dietitian about what dietary choices are best for you. Grains Baked goods made with fat, such as croissants, muffins, or some breads. Dry pasta or rice meal packs. Vegetables Creamed or fried vegetables. Vegetables in a cheese sauce. Regular canned vegetables (not low-sodium or reduced-sodium). Regular canned tomato sauce and paste (not low-sodium or reduced-sodium). Regular tomato and vegetable juice (not low-sodium or reduced-sodium). Angie Fava. Olives. Fruits Canned fruit in a light or heavy syrup. Fried fruit. Fruit in cream or butter sauce. Meat and other protein foods Fatty cuts of meat. Ribs. Fried  meat. Berniece Salines. Sausage. Bologna and other processed lunch meats. Salami. Fatback. Hotdogs. Bratwurst. Salted nuts and seeds. Canned beans with added salt. Canned or smoked fish. Whole eggs or egg yolks. Chicken or [place] with skin. Dairy Whole or 2% milk, cream, and half-and-half. Whole or full-fat cream cheese. Whole-fat or sweetened yogurt. Full-fat cheese. Nondairy creamers. Whipped toppings. Processed cheese and cheese spreads. Fats and oils Butter. Stick margarine. Lard. Shortening. Ghee. Bacon fat. Tropical oils, such as coconut, palm kernel, or palm oil. Seasoning and other foods Salted popcorn and pretzels. Onion salt, garlic salt, seasoned salt, table salt, and sea salt. Worcestershire sauce. Tartar sauce. Barbecue sauce. Teriyaki sauce. Soy sauce, including reduced-sodium. Steak sauce. Canned and packaged gravies. Fish sauce. Oyster sauce. Cocktail sauce. Horseradish that you find on the shelf. Ketchup. Mustard. Meat flavorings and tenderizers. Bouillon cubes. Hot sauce and Tabasco sauce. Premade or packaged marinades. Premade or packaged taco seasonings. Relishes. Regular salad dressings. Where to find more information:  National Heart, Lung, and [place]: https://wilson-eaton.com/  American Heart Association: www.heart.org Summary  The DASH eating plan is a healthy eating plan that has been shown to reduce high blood pressure (hypertension). It may also reduce your risk for type 2 diabetes, heart disease, and stroke.  With the DASH eating plan, you  should limit salt (sodium) intake to 2,300 mg a day. If you have hypertension, you may need to reduce your sodium intake to 1,500 mg a day.  When on the DASH eating plan, aim to eat more fresh fruits and vegetables, whole grains, lean proteins, low-fat dairy, and heart-healthy fats.  Work with your health care provider or diet and nutrition specialist (dietitian) to adjust your eating plan to your individual calorie needs. This information is  not intended to replace advice given to you by your health care provider. Make sure you discuss any questions you have with your health care provider. Document Released: 12/19/2010 Document Revised: 12/24/2015 Document Reviewed: 12/24/2015 Elsevier Interactive Patient Education  [employer].

## 2017-06-04 NOTE — Assessment & Plan Note (Signed)
Problematic for years, intolerant to temazepam and Ambien. Discussed various options, will trial Trazodone. She will update via My Chart in a few weeks.

## 2017-06-04 NOTE — Assessment & Plan Note (Signed)
Borderline in the office today, lower with home readings. Will have her continue to monitor BP at home and report readings at or above 135/90.

## 2017-08-04 ENCOUNTER — Other Ambulatory Visit: Payer: Self-pay | Admitting: Primary Care

## 2017-08-04 DIAGNOSIS — G4700 Insomnia, unspecified: Secondary | ICD-10-CM

## 2017-09-21 ENCOUNTER — Other Ambulatory Visit: Payer: Self-pay | Admitting: Primary Care

## 2017-09-21 DIAGNOSIS — G4700 Insomnia, unspecified: Secondary | ICD-10-CM

## 2017-09-21 NOTE — Telephone Encounter (Signed)
Electronic refill request Last office visit 06/04/17 Last refill 08/04/17 #90

## 2017-09-22 NOTE — Telephone Encounter (Signed)
Noted, refill sent to pharmacy. 

## 2017-10-21 ENCOUNTER — Other Ambulatory Visit: Payer: Self-pay | Admitting: Primary Care

## 2017-10-21 DIAGNOSIS — Z0000 Encounter for general adult medical examination without abnormal findings: Secondary | ICD-10-CM

## 2017-10-26 ENCOUNTER — Other Ambulatory Visit (INDEPENDENT_AMBULATORY_CARE_PROVIDER_SITE_OTHER): Payer: Managed Care, Other (non HMO)

## 2017-10-26 ENCOUNTER — Telehealth: Payer: Self-pay | Admitting: Primary Care

## 2017-10-26 DIAGNOSIS — Z0000 Encounter for general adult medical examination without abnormal findings: Secondary | ICD-10-CM | POA: Diagnosis not present

## 2017-10-26 NOTE — Telephone Encounter (Signed)
Completed and placed in Chans inbox. 

## 2017-10-26 NOTE — Telephone Encounter (Signed)
Place form/paperwork in [facility]'s inbox for review and complete if necessary.  

## 2017-10-26 NOTE — Telephone Encounter (Signed)
Pt dropped off health screening form Pt has appointment 10/21 Paperwork in [facility] up front

## 2017-10-27 LAB — LIPID PANEL
CHOLESTEROL TOTAL: 199 mg/dL (ref 100–199)
Chol/HDL Ratio: 3.6 ratio (ref 0.0–4.4)
HDL: 55 mg/dL (ref >39)
LDL Calculated: 114 mg/dL — ABNORMAL HIGH (ref 0–99)
Triglycerides: 149 mg/dL (ref 0–149)
VLDL CHOLESTEROL CAL: 30 mg/dL (ref 5–40)

## 2017-10-27 LAB — BASIC METABOLIC PANEL
BUN / CREAT RATIO: 17 (ref 9–23)
BUN: 17 mg/dL (ref 6–24)
CALCIUM: 9.1 mg/dL (ref 8.7–10.2)
CHLORIDE: 104 mmol/L (ref 96–106)
CO2: 24 mmol/L (ref 20–29)
Creatinine, Ser: 1.02 mg/dL — ABNORMAL HIGH (ref 0.57–1.00)
GFR calc Af Amer: 77 mL/min/{1.73_m2} (ref >59)
GFR calc non Af Amer: 67 mL/min/{1.73_m2} (ref >59)
GLUCOSE: 89 mg/dL (ref 65–99)
POTASSIUM: 4.4 mmol/L (ref 3.5–5.2)
Sodium: 140 mmol/L (ref 134–144)

## 2017-10-27 NOTE — Telephone Encounter (Signed)
Spoken to patient this morning and she will pick up form. It was already left in the front office

## 2017-11-02 ENCOUNTER — Encounter: Payer: Self-pay | Admitting: Primary Care

## 2017-11-02 ENCOUNTER — Ambulatory Visit (INDEPENDENT_AMBULATORY_CARE_PROVIDER_SITE_OTHER): Payer: Managed Care, Other (non HMO) | Admitting: Primary Care

## 2017-11-02 VITALS — BP 130/76 | HR 73 | Temp 98.6°F | Ht 66.0 in | Wt 187.5 lb

## 2017-11-02 DIAGNOSIS — F3342 Major depressive disorder, recurrent, in full remission: Secondary | ICD-10-CM | POA: Diagnosis not present

## 2017-11-02 DIAGNOSIS — K219 Gastro-esophageal reflux disease without esophagitis: Secondary | ICD-10-CM | POA: Diagnosis not present

## 2017-11-02 DIAGNOSIS — Z0000 Encounter for general adult medical examination without abnormal findings: Secondary | ICD-10-CM | POA: Diagnosis not present

## 2017-11-02 DIAGNOSIS — G44229 Chronic tension-type headache, not intractable: Secondary | ICD-10-CM

## 2017-11-02 DIAGNOSIS — G4700 Insomnia, unspecified: Secondary | ICD-10-CM | POA: Diagnosis not present

## 2017-11-02 DIAGNOSIS — R030 Elevated blood-pressure reading, without diagnosis of hypertension: Secondary | ICD-10-CM

## 2017-11-02 MED ORDER — CITALOPRAM HYDROBROMIDE 10 MG PO TABS: 10.0000 mg | ORAL_TABLET | Freq: Every day | ORAL | 1 refills | Status: DC

## 2017-11-02 NOTE — Assessment & Plan Note (Addendum)
Referred to therapy, could not afford to attend.  PHQ 9 score of 15 today. Agreed to resume citalopram 10 mg, she will update in 4 weeks. Denies SI/HI.

## 2017-11-02 NOTE — Progress Notes (Signed)
Subjective:    Patient ID: Maureen Aguilar, female    DOB: January 31, 1975, 42 y.o.   MRN: [id number]  HPI  Maureen Aguilar is a 42 year old female who presents today for complete physical.  Immunizations: -Tetanus: Completed in 2016 -Influenza: Declines    Diet: She endorses a fair diet Breakfast: Skips Lunch: Skips Dinner: Sandwich, frozen dinner Snacks: Fruit Desserts: None Beverages: Water, vitamin water, diet green tea  Exercise: She is walking three days weekly, one hour at a time Eye exam: Completed in 2018 Dental exam: Completes semi-annually  Pap Smear: Follows with GYN  Mammogram: Completed in December 2018   Review of Systems  Constitutional: Negative for unexpected weight change.  HENT: Negative for rhinorrhea.   Respiratory: Negative for cough and shortness of breath.   Cardiovascular: Negative for chest pain.  Gastrointestinal: Negative for constipation and diarrhea.  Genitourinary: Negative for difficulty urinating and menstrual problem.  Musculoskeletal: Negative for arthralgias and myalgias.  Skin: Negative for rash.  Allergic/Immunologic: Negative for environmental allergies.  Neurological: Negative for dizziness, numbness and headaches.  Psychiatric/Behavioral:       Symptoms of tearfulness, feels as depression is returning and would like to retry citalopram.        Past Medical History:  Diagnosis Date  . Depression   . Endometriosis   . Frequent headaches   . GERD (gastroesophageal reflux disease)   . History of kidney stones   . History of UTI   . 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 Marital status: Single    Spouse name: Not on file  . Number of children: Not on file  . Years of education: Not on file  . Highest education level: Not on file  Occupational History  . Not on file  Social Needs  . Financial resource strain: Not on file  . Food insecurity:    Worry: Not on file    Inability: Not on  file  . Transportation needs:    Medical: Not on file    Non-medical: Not on file  Tobacco Use  . Smoking status: Never Smoker  . Smokeless tobacco: Never Used  Substance and Sexual Activity  . Alcohol use: No    Alcohol/week: 0.0 standard drinks  . Drug use: Not on file  . Sexual activity: Not on file  Lifestyle  . Physical activity:    Days per week: Not on file    Minutes per session: Not on file  . Stress: Not on file  Relationships  . Social connections:    Talks on phone: Not on file    Gets together: Not on file    Attends religious service: Not on file    Active member of club or organization: Not on file    Attends meetings of clubs or organizations: Not on file    Relationship status: Not on file  . Intimate partner violence:    Fear of current or ex partner: Not on file    Emotionally abused: Not on file    Physically abused: Not on file    Forced sexual activity: Not on file  Other Topics Concern  . Not on file  Social History Narrative   Single.   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   Highest level of education 12th grade.   Enjoys reading, driving.    Past Surgical History:  Procedure Laterality Date  .  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 EXCISION OF ENDOMETRIOMA  2014  . LITHOTRIPSY  2014  . PARATHYROIDECTOMY  2014   removal of one.    Family History  Problem Relation Age of Onset  . Alcohol abuse Mother   . Arthritis Mother   . Mental illness Mother   . Mental illness Brother        bipolar  . Cancer Maternal Aunt        breast  . Breast cancer Maternal Aunt 29  . Mental illness Maternal Grandmother   . Asthma Maternal Grandmother   . Hyperlipidemia Maternal Grandmother   . Hypertension Maternal Grandmother   . Heart disease Maternal Grandfather     No Known Allergies  Current Outpatient Medications on File Prior to Visit  Medication Sig Dispense Refill  . Norethindrone Acetate-Ethinyl  Estrad-FE (LOESTRIN 24 FE) 1-20 MG-MCG(24) tablet Take 1 pill daily  3  . ranitidine (ZANTAC) 150 MG tablet Take 1 tablet by mouth once to twice daily for acid reflux. 180 tablet 0  . traZODone (DESYREL) 50 MG tablet Take 1-2 tablets by mouth at bedtime as needed for sleep. 180 tablet 0   No current facility-administered medications on file prior to visit.     BP 130/76   Pulse 73   Temp 98.6 F (37 C) (Oral)   Ht 5\' 6"  (1.676 m)   Wt 187 lb 8 oz (85 kg)   LMP 10/06/2017   SpO2 99%   BMI 30.26 kg/m    Objective:   Physical Exam  Constitutional: She is oriented to person, place, and time. She appears well-nourished.  HENT:  Mouth/Throat: No oropharyngeal exudate.  Eyes: Pupils are equal, round, and reactive to light. EOM are normal.  Neck: Neck supple. No thyromegaly present.  Cardiovascular: Normal rate and regular rhythm.  Respiratory: Effort normal and breath sounds normal.  GI: Soft. Bowel sounds are normal. There is no tenderness.  Musculoskeletal: Normal range of motion.  Neurological: She is alert and oriented to person, place, and time.  Skin: Skin is warm and dry.  Psychiatric: She has a normal mood and affect.           Assessment & Plan:

## 2017-11-02 NOTE — Assessment & Plan Note (Signed)
Stable in the office today, continue to monitor.  

## 2017-11-02 NOTE — Assessment & Plan Note (Signed)
Immunizations UTD. Does need MMR booster as recent labs do not suggest immunity. She will schedule a nurse visit for MMR and influenza vaccination. Pap smear UTD per patient. Discussed to work on diet, regular exercise. Exam unremarkable.  Labs reviewed. Follow up in 1 year for CPE.

## 2017-11-02 NOTE — Assessment & Plan Note (Signed)
Doing well on Zantac PRN, continue same.

## 2017-11-02 NOTE — Assessment & Plan Note (Signed)
Overall doing well on Trazodone 100 mg PRN. Continue same.

## 2017-11-02 NOTE — Patient Instructions (Signed)
Start exercising. You should be getting 150 minutes of moderate intensity exercise weekly.  Make sure to eat plenty of vegetables, fruit, whole grains, lean protein.  Ensure you are consuming 64 ounces of water daily.  Please update me in 4 weeks regarding your depression since we resumed your citalopram.  Schedule a nurse visit for your MMR and influenza vaccinations.   It was a pleasure to see you today!   Preventive Care 40-64 Years, Female Preventive care refers to lifestyle choices and visits with your health care provider that can promote health and wellness. What does preventive care include?  A yearly physical exam. This is also called an annual well check.  Dental exams once or twice a year.  Routine eye exams. Ask your health care provider how often you should have your eyes checked.  Personal lifestyle choices, including: ? Daily care of your teeth and gums. ? Regular physical activity. ? Eating a healthy diet. ? Avoiding tobacco and drug use. ? Limiting alcohol use. ? Practicing safe sex. ? Taking low-dose aspirin daily starting at age 70. ? Taking vitamin and mineral supplements as recommended by your health care provider. What happens during an annual well check? The services and screenings done by your health care provider during your annual well check will depend on your age, overall health, lifestyle risk factors, and family history of disease. Counseling Your health care provider may ask you questions about your:  Alcohol use.  Tobacco use.  Drug use.  Emotional well-being.  Home and relationship well-being.  Sexual activity.  Eating habits.  Work and work Statistician.  Method of birth control.  Menstrual cycle.  Pregnancy history.  Screening You may have the following tests or measurements:  Height, weight, and BMI.  Blood pressure.  Lipid and cholesterol levels. These may be checked every 5 years, or more frequently if you are over  14 years old.  Skin check.  Lung cancer screening. You may have this screening every year starting at age 72 if you have a 30-pack-year history of smoking and currently smoke or have quit within the past 15 years.  Fecal occult blood test (FOBT) of the stool. You may have this test every year starting at age 81.  Flexible sigmoidoscopy or colonoscopy. You may have a sigmoidoscopy every 5 years or a colonoscopy every 10 years starting at age 55.  Hepatitis C blood test.  Hepatitis B blood test.  Sexually transmitted disease (STD) testing.  Diabetes screening. This is done by checking your blood sugar (glucose) after you have not eaten for a while (fasting). You may have this done every 1-3 years.  Mammogram. This may be done every 1-2 years. Talk to your health care provider about when you should start having regular mammograms. This may depend on whether you have a family history of breast cancer.  BRCA-related cancer screening. This may be done if you have a family history of breast, ovarian, tubal, or peritoneal cancers.  Pelvic exam and Pap test. This may be done every 3 years starting at age 9. Starting at age 86, this may be done every 5 years if you have a Pap test in combination with an HPV test.  Bone density scan. This is done to screen for osteoporosis. You may have this scan if you are at high risk for osteoporosis.  Discuss your test results, treatment options, and if necessary, the need for more tests with your health care provider. Vaccines Your health care provider may recommend  certain vaccines, such as:  Influenza vaccine. This is recommended every year.  Tetanus, diphtheria, and acellular pertussis (Tdap, Td) vaccine. You may need a Td booster every 10 years.  Varicella vaccine. You may need this if you have not been vaccinated.  Zoster vaccine. You may need this after age 44.  Measles, mumps, and rubella (MMR) vaccine. You may need at least one dose of MMR if  you were born in 1957 or later. You may also need a second dose.  Pneumococcal 13-valent conjugate (PCV13) vaccine. You may need this if you have certain conditions and were not previously vaccinated.  Pneumococcal polysaccharide (PPSV23) vaccine. You may need one or two doses if you smoke cigarettes or if you have certain conditions.  Meningococcal vaccine. You may need this if you have certain conditions.  Hepatitis A vaccine. You may need this if you have certain conditions or if you travel or work in places where you may be exposed to hepatitis A.  Hepatitis B vaccine. You may need this if you have certain conditions or if you travel or work in places where you may be exposed to hepatitis B.  Haemophilus influenzae type b (Hib) vaccine. You may need this if you have certain conditions.  Talk to your health care provider about which screenings and vaccines you need and how often you need them. This information is not intended to replace advice given to you by your health care provider. Make sure you discuss any questions you have with your health care provider. Document Released: 01/26/2015 Document Revised: 09/19/2015 Document Reviewed: 10/31/2014 Elsevier Interactive Patient Education  [employer].

## 2017-11-02 NOTE — Assessment & Plan Note (Signed)
Infrequent overall. Continue to monitor.

## 2017-11-03 ENCOUNTER — Other Ambulatory Visit: Payer: Self-pay | Admitting: Primary Care

## 2017-11-03 DIAGNOSIS — K219 Gastro-esophageal reflux disease without esophagitis: Secondary | ICD-10-CM

## 2017-11-04 NOTE — Telephone Encounter (Signed)
Noted, refill sent to pharmacy. 

## 2017-11-04 NOTE — Telephone Encounter (Signed)
Last prescribed on 10/30/2016  Last office visit on 11/02/2017

## 2017-11-06 ENCOUNTER — Encounter: Payer: Self-pay | Admitting: *Deleted

## 2017-11-18 ENCOUNTER — Ambulatory Visit (INDEPENDENT_AMBULATORY_CARE_PROVIDER_SITE_OTHER): Payer: Managed Care, Other (non HMO)

## 2017-11-18 DIAGNOSIS — Z23 Encounter for immunization: Secondary | ICD-10-CM | POA: Diagnosis not present

## 2017-12-20 ENCOUNTER — Other Ambulatory Visit: Payer: Self-pay | Admitting: Primary Care

## 2017-12-20 DIAGNOSIS — G4700 Insomnia, unspecified: Secondary | ICD-10-CM

## 2018-04-07 ENCOUNTER — Other Ambulatory Visit: Payer: Self-pay | Admitting: Primary Care

## 2018-04-07 DIAGNOSIS — K219 Gastro-esophageal reflux disease without esophagitis: Secondary | ICD-10-CM

## 2018-04-24 ENCOUNTER — Other Ambulatory Visit: Payer: Self-pay | Admitting: Primary Care

## 2018-04-24 DIAGNOSIS — F3342 Major depressive disorder, recurrent, in full remission: Secondary | ICD-10-CM

## 2018-05-14 ENCOUNTER — Telehealth: Payer: Managed Care, Other (non HMO) | Admitting: Family

## 2018-05-14 DIAGNOSIS — R300 Dysuria: Secondary | ICD-10-CM

## 2018-05-14 MED ORDER — NITROFURANTOIN MONOHYD MACRO 100 MG PO CAPS: 100.0000 mg | ORAL_CAPSULE | Freq: Two times a day (BID) | ORAL | 0 refills | Status: DC

## 2018-05-14 NOTE — Progress Notes (Signed)

## 2018-05-21 ENCOUNTER — Other Ambulatory Visit: Payer: Self-pay | Admitting: Primary Care

## 2018-05-21 DIAGNOSIS — K219 Gastro-esophageal reflux disease without esophagitis: Secondary | ICD-10-CM

## 2018-06-23 ENCOUNTER — Other Ambulatory Visit: Payer: Self-pay | Admitting: Primary Care

## 2018-06-23 DIAGNOSIS — G4700 Insomnia, unspecified: Secondary | ICD-10-CM

## 2018-07-24 ENCOUNTER — Other Ambulatory Visit: Payer: Self-pay | Admitting: Primary Care

## 2018-07-24 DIAGNOSIS — F3342 Major depressive disorder, recurrent, in full remission: Secondary | ICD-10-CM

## 2018-10-24 ENCOUNTER — Other Ambulatory Visit: Payer: Self-pay | Admitting: Primary Care

## 2018-10-24 DIAGNOSIS — F3342 Major depressive disorder, recurrent, in full remission: Secondary | ICD-10-CM

## 2018-11-16 ENCOUNTER — Encounter: Payer: Self-pay | Admitting: Primary Care

## 2018-11-16 ENCOUNTER — Ambulatory Visit (INDEPENDENT_AMBULATORY_CARE_PROVIDER_SITE_OTHER): Payer: Managed Care, Other (non HMO) | Admitting: Primary Care

## 2018-11-16 ENCOUNTER — Other Ambulatory Visit: Payer: Self-pay

## 2018-11-16 VITALS — BP 126/76 | HR 83 | Temp 97.7°F | Ht 66.0 in | Wt 194.5 lb

## 2018-11-16 DIAGNOSIS — K219 Gastro-esophageal reflux disease without esophagitis: Secondary | ICD-10-CM

## 2018-11-16 DIAGNOSIS — G4700 Insomnia, unspecified: Secondary | ICD-10-CM | POA: Diagnosis not present

## 2018-11-16 DIAGNOSIS — Z23 Encounter for immunization: Secondary | ICD-10-CM | POA: Diagnosis not present

## 2018-11-16 DIAGNOSIS — Z0000 Encounter for general adult medical examination without abnormal findings: Secondary | ICD-10-CM

## 2018-11-16 DIAGNOSIS — Z1211 Encounter for screening for malignant neoplasm of colon: Secondary | ICD-10-CM

## 2018-11-16 DIAGNOSIS — Z9009 Acquired absence of other part of head and neck: Secondary | ICD-10-CM

## 2018-11-16 DIAGNOSIS — Z9109 Other allergy status, other than to drugs and biological substances: Secondary | ICD-10-CM

## 2018-11-16 DIAGNOSIS — G44229 Chronic tension-type headache, not intractable: Secondary | ICD-10-CM

## 2018-11-16 DIAGNOSIS — F3342 Major depressive disorder, recurrent, in full remission: Secondary | ICD-10-CM

## 2018-11-16 DIAGNOSIS — Z1231 Encounter for screening mammogram for malignant neoplasm of breast: Secondary | ICD-10-CM

## 2018-11-16 DIAGNOSIS — E892 Postprocedural hypoparathyroidism: Secondary | ICD-10-CM | POA: Insufficient documentation

## 2018-11-16 MED ORDER — METHYLPREDNISOLONE ACETATE 80 MG/ML IJ SUSP
80.0000 mg | Freq: Once | INTRAMUSCULAR | Status: AC
  Administered 2018-11-16: 12:00:00 80 mg via INTRAMUSCULAR

## 2018-11-16 NOTE — Assessment & Plan Note (Signed)
Tetanus UTD. Influenza vaccination provided today. Pap smear UTD per patient. Colonoscopy due, referral placed to GI. Mammogram due, orders placed. Discussed the importance of a healthy diet and regular exercise in order for weight loss, and to reduce the risk of any potential medical problems. Exam today unremarkable. Labs pending.

## 2018-11-16 NOTE — Assessment & Plan Note (Signed)
Doing well on citalopram 10 mg, continue same. Denies SI/HI.

## 2018-11-16 NOTE — Patient Instructions (Signed)
You will be contacted regarding your referral to GI for the colonoscopy.  Please let us know if you have not been contacted within two weeks.   Call the breast center for your mammogram.  Please update me in a week regarding your symptoms as discussed.  Start exercising. You should be getting 150 minutes of moderate intensity exercise weekly.  It's important to improve your diet by reducing consumption of fast food, fried food, processed snack foods, sugary drinks. Increase consumption of fresh vegetables and fruits, whole grains, water.  Ensure you are drinking 64 ounces of water daily.  It was a pleasure to see you today!   Preventive Care 45-72 Years Old, Female Preventive care refers to visits with your health care provider and lifestyle choices that can promote health and wellness. This includes:  A yearly physical exam. This may also be called an annual well check.  Regular dental visits and eye exams.  Immunizations.  Screening for certain conditions.  Healthy lifestyle choices, such as eating a healthy diet, getting regular exercise, not using drugs or products that contain nicotine and tobacco, and limiting alcohol use. What can I expect for my preventive care visit? Physical exam Your health care provider will check your:  Height and weight. This may be used to calculate body mass index (BMI), which tells if you are at a healthy weight.  Heart rate and blood pressure.  Skin for abnormal spots. Counseling Your health care provider may ask you questions about your:  Alcohol, tobacco, and drug use.  Emotional well-being.  Home and relationship well-being.  Sexual activity.  Eating habits.  Work and work Statistician.  Method of birth control.  Menstrual cycle.  Pregnancy history. What immunizations do I need?  Influenza (flu) vaccine  This is recommended every year. Tetanus, diphtheria, and pertussis (Tdap) vaccine  You may need a Td booster every  10 years. Varicella (chickenpox) vaccine  You may need this if you have not been vaccinated. Zoster (shingles) vaccine  You may need this after age 45. Measles, mumps, and rubella (MMR) vaccine  You may need at least one dose of MMR if you were born in 1957 or later. You may also need a second dose. Pneumococcal conjugate (PCV13) vaccine  You may need this if you have certain conditions and were not previously vaccinated. Pneumococcal polysaccharide (PPSV23) vaccine  You may need one or two doses if you smoke cigarettes or if you have certain conditions. Meningococcal conjugate (MenACWY) vaccine  You may need this if you have certain conditions. Hepatitis A vaccine  You may need this if you have certain conditions or if you travel or work in places where you may be exposed to hepatitis A. Hepatitis B vaccine  You may need this if you have certain conditions or if you travel or work in places where you may be exposed to hepatitis B. Haemophilus influenzae type b (Hib) vaccine  You may need this if you have certain conditions. Human papillomavirus (HPV) vaccine  If recommended by your health care provider, you may need three doses over 6 months. You may receive vaccines as individual doses or as more than one vaccine together in one shot (combination vaccines). Talk with your health care provider about the risks and benefits of combination vaccines. What tests do I need? Blood tests  Lipid and cholesterol levels. These may be checked every 5 years, or more frequently if you are over 42 years old.  Hepatitis C test.  Hepatitis B test.  Screening  Lung cancer screening. You may have this screening every year starting at age 45 if you have a 30-pack-year history of smoking and currently smoke or have quit within the past 15 years.  Colorectal cancer screening. All adults should have this screening starting at age 45 and continuing until age 55. Your health care provider may  recommend screening at age 45 if you are at increased risk. You will have tests every 1-10 years, depending on your results and the type of screening test.  Diabetes screening. This is done by checking your blood sugar (glucose) after you have not eaten for a while (fasting). You may have this done every 1-3 years.  Mammogram. This may be done every 1-2 years. Talk with your health care provider about when you should start having regular mammograms. This may depend on whether you have a family history of breast cancer.  BRCA-related cancer screening. This may be done if you have a family history of breast, ovarian, tubal, or peritoneal cancers.  Pelvic exam and Pap test. This may be done every 3 years starting at age 37. Starting at age 35, this may be done every 5 years if you have a Pap test in combination with an HPV test. Other tests  Sexually transmitted disease (STD) testing.  Bone density scan. This is done to screen for osteoporosis. You may have this scan if you are at high risk for osteoporosis. Follow these instructions at home: Eating and drinking  Eat a diet that includes fresh fruits and vegetables, whole grains, lean protein, and low-fat dairy.  Take vitamin and mineral supplements as recommended by your health care provider.  Do not drink alcohol if: ? Your health care provider tells you not to drink. ? You are pregnant, may be pregnant, or are planning to become pregnant.  If you drink alcohol: ? Limit how much you have to 0-1 drink a day. ? Be aware of how much alcohol is in your drink. In the [place] one drink equals one 12 oz bottle of beer (355 mL), one 5 oz glass of wine (148 mL), or one 1 oz glass of hard liquor (44 mL). Lifestyle  Take daily care of your teeth and gums.  Stay active. Exercise for at least 30 minutes on 5 or more days each week.  Do not use any products that contain nicotine or tobacco, such as cigarettes, e-cigarettes, and chewing tobacco. If  you need help quitting, ask your health care provider.  If you are sexually active, practice safe sex. Use a condom or other form of birth control (contraception) in order to prevent pregnancy and STIs (sexually transmitted infections).  If told by your health care provider, take low-dose aspirin daily starting at age 88. What's next?  Visit your health care provider once a year for a well check visit.  Ask your health care provider how often you should have your eyes and teeth checked.  Stay up to date on all vaccines. This information is not intended to replace advice given to you by your health care provider. Make sure you discuss any questions you have with your health care provider. Document Released: 01/26/2015 Document Revised: 09/10/2017 Document Reviewed: 09/10/2017 Elsevier Patient Education  2020 [employer].

## 2018-11-16 NOTE — Assessment & Plan Note (Signed)
Removal of one parathyroid gland in 2014. PTH pending.

## 2018-11-16 NOTE — Progress Notes (Signed)
Subjective:    Patient ID: Maureen Aguilar, female    DOB: Oct 04, 1975, 43 y.o.   MRN: [id number]  HPI  Maureen Aguilar is a 43 year old female who presents today for complete physical.  She would also like to discuss potential allergy symptoms. She's been working from home since Covid-19, has cats. Over the last 9 weeks she's developed chronic throat congestion, sinus pressure, sneezing. No prior history of allergies. She denies fevers, feeling sick, cough. She's blowing clear mucus from her nasal cavity. She's tried benadryl, Zyrtec, Mucinex, Flonase, and several other OTC treatments without improvement. Symptoms are no worse but are not any better.   Immunizations: -Tetanus: Completed in 2016 -Influenza: Due today  Diet: She endorses a healthy diet, home cooked meals. Desserts 1-2 times weekly. She is drinking water, crystal light, little sweet tea. Exercise: She is walking 3 miles five days weekly.  Eye exam: Completed in 2020 Dental exam: Completes semi-annually   Pap Smear: Completed in March 2019 Mammogram: Completed in 2018 Colonoscopy: Never completed  BP Readings from Last 3 Encounters:  11/16/18 126/76  11/02/17 130/76  06/04/17 134/86     Review of Systems  Constitutional: Positive for fatigue. Negative for unexpected weight change.  HENT: Positive for congestion, postnasal drip, sinus pressure and sneezing. Negative for rhinorrhea.   Eyes: Negative for visual disturbance.  Respiratory: Negative for cough and shortness of breath.   Cardiovascular: Negative for chest pain.  Gastrointestinal: Negative for constipation and diarrhea.  Genitourinary: Negative for difficulty urinating.  Musculoskeletal: Negative for arthralgias and myalgias.  Skin: Negative for rash.  Allergic/Immunologic: Positive for environmental allergies.  Neurological: Negative for dizziness, numbness and headaches.  Psychiatric/Behavioral: Negative for suicidal ideas. The patient is not  nervous/anxious.        Past Medical History:  Diagnosis Date  . Depression   . Endometriosis   . Frequent headaches   . GERD (gastroesophageal reflux disease)   . History of kidney stones   . History of UTI   . Parathyroid abnormality [facility])      Social History   Socioeconomic History  . Marital status: Single    Spouse name: Not on file  . Number of children: Not on file  . Years of education: Not on file  . Highest education level: Not on file  Occupational History  . Not on file  Social Needs  . Financial resource strain: Not on file  . Food insecurity    Worry: Not on file    Inability: Not on file  . Transportation needs    Medical: Not on file    Non-medical: Not on file  Tobacco Use  . Smoking status: Never Smoker  . Smokeless tobacco: Never Used  Substance and Sexual Activity  . Alcohol use: No    Alcohol/week: 0.0 standard drinks  . Drug use: Not on file  . Sexual activity: Not on file  Lifestyle  . Physical activity    Days per week: Not on file    Minutes per session: Not on file  . Stress: Not on file  Relationships  . Social Herbalist on phone: Not on file    Gets together: Not on file    Attends religious service: Not on file    Active member of club or organization: Not on file    Attends meetings of clubs or organizations: Not on file    Relationship status: Not on file  . Intimate partner violence  Fear of current or ex partner: Not on file    Emotionally abused: Not on file    Physically abused: Not on file    Forced sexual activity: Not on file  Other Topics Concern  . Not on file  Social History Narrative   Single.   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   Highest level of education 12th grade.   Enjoys reading, driving.    Past Surgical History:  Procedure Laterality Date  . 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 EXCISION OF ENDOMETRIOMA  2014  . LITHOTRIPSY  2014  .  PARATHYROIDECTOMY  2014   removal of one.    Family History  Problem Relation Age of Onset  . Alcohol abuse Mother   . Arthritis Mother   . Mental illness Mother   . Mental illness Brother        bipolar  . Cancer Maternal Aunt        breast  . Breast cancer Maternal Aunt 29  . Mental illness Maternal Grandmother   . Asthma Maternal Grandmother   . Hyperlipidemia Maternal Grandmother   . Hypertension Maternal Grandmother   . Heart disease Maternal Grandfather     No Known Allergies  Current Outpatient Medications on File Prior to Visit  Medication Sig Dispense Refill  . citalopram (CELEXA) 10 MG tablet Take 1 tablet (10 mg total) by mouth daily. NEED APPOINTMENT FOR ANY MORE REFILLS 90 tablet 0  . Norethindrone Acetate-Ethinyl Estrad-FE (LOESTRIN 24 FE) 1-20 MG-MCG(24) tablet Take 1 pill daily  3  . traZODone (DESYREL) 50 MG tablet TAKE 1 TO 2 TABLETS BY MOUTH AT BEDTIME AS NEEDED FOR SLEEP 180 tablet 1   No current facility-administered medications on file prior to visit.     BP 126/76   Pulse 83   Temp 97.7 F (36.5 C) (Temporal)   Ht 5\' 6"  (1.676 m)   Wt 194 lb 8 oz (88.2 kg)   LMP 11/16/2018   SpO2 98%   BMI 31.39 kg/m    Objective:   Physical Exam  Constitutional: She is oriented to person, place, and time. She appears well-nourished. She does not appear ill.  HENT:  Right Ear: Tympanic membrane and ear canal normal.  Left Ear: Tympanic membrane and ear canal normal.  Mouth/Throat: Oropharynx is clear and moist.  Eyes: Pupils are equal, round, and reactive to light. EOM are normal.  Neck: Neck supple.  Cardiovascular: Normal rate and regular rhythm.  Respiratory: Effort normal and breath sounds normal.  GI: Soft. Bowel sounds are normal. There is no abdominal tenderness.  Musculoskeletal: Normal range of motion.  Neurological: She is alert and oriented to person, place, and time.  Skin: Skin is warm and dry.  Psychiatric: She has a normal mood and  affect.           Assessment & Plan:

## 2018-11-16 NOTE — Addendum Note (Signed)
Addended by: Jacqualin Combes on: 11/16/2018 12:29 PM   Modules accepted: Orders

## 2018-11-16 NOTE — Assessment & Plan Note (Signed)
Chronic. Using Trazodone PRN, continue same.

## 2018-11-16 NOTE — Assessment & Plan Note (Signed)
Symptoms more representative of environmental allergies. She doesn't appear sickly. Could be secondary to cat danger as she is working at home now.  Discussed options for treatment. Given that she doesn't appear sickly we will hold off on antibiotic use. IM Depo Medrol 80 mg provided today. Consider Singulair.  Check PTH given history of tumor. She will update.

## 2018-11-16 NOTE — Assessment & Plan Note (Signed)
Intermittent reflux, using Zantac PRN. Discussed to avoid Zantac, switch to Pepcid.

## 2018-11-16 NOTE — Assessment & Plan Note (Signed)
Overall stable until 2 months ago with allergy symptoms. Continue OTC treatment.

## 2018-11-17 LAB — LIPID PANEL
Chol/HDL Ratio: 4.1 ratio (ref 0.0–4.4)
Cholesterol, Total: 224 mg/dL — ABNORMAL HIGH (ref 100–199)
HDL: 54 mg/dL (ref >39)
LDL Chol Calc (NIH): 139 mg/dL — ABNORMAL HIGH (ref 0–99)
Triglycerides: 173 mg/dL — ABNORMAL HIGH (ref 0–149)
VLDL Cholesterol Cal: 31 mg/dL (ref 5–40)

## 2018-11-17 LAB — CBC
Hematocrit: 39.7 % (ref 34.0–46.6)
Hemoglobin: 13.4 g/dL (ref 11.1–15.9)
MCH: 27.7 pg (ref 26.6–33.0)
MCHC: 33.8 g/dL (ref 31.5–35.7)
MCV: 82 fL (ref 79–97)
Platelets: 271 10*3/uL (ref 150–450)
RBC: 4.83 x10E6/uL (ref 3.77–5.28)
RDW: 14.4 % (ref 11.7–15.4)
WBC: 8.5 10*3/uL (ref 3.4–10.8)

## 2018-11-17 LAB — COMPREHENSIVE METABOLIC PANEL
ALT: 9 IU/L (ref 0–32)
AST: 14 IU/L (ref 0–40)
Albumin/Globulin Ratio: 1.4 (ref 1.2–2.2)
Albumin: 4.2 g/dL (ref 3.8–4.8)
Alkaline Phosphatase: 74 IU/L (ref 39–117)
BUN/Creatinine Ratio: 14 (ref 9–23)
BUN: 13 mg/dL (ref 6–24)
Bilirubin Total: 0.4 mg/dL (ref 0.0–1.2)
CO2: 22 mmol/L (ref 20–29)
Calcium: 9.6 mg/dL (ref 8.7–10.2)
Chloride: 104 mmol/L (ref 96–106)
Creatinine, Ser: 0.95 mg/dL (ref 0.57–1.00)
GFR calc Af Amer: 84 mL/min/{1.73_m2} (ref >59)
GFR calc non Af Amer: 73 mL/min/{1.73_m2} (ref >59)
Globulin, Total: 3 g/dL (ref 1.5–4.5)
Glucose: 89 mg/dL (ref 65–99)
Potassium: 4.8 mmol/L (ref 3.5–5.2)
Sodium: 139 mmol/L (ref 134–144)
Total Protein: 7.2 g/dL (ref 6.0–8.5)

## 2018-11-17 LAB — PARATHYROID HORMONE, INTACT (NO CA): PTH: 35 pg/mL (ref 15–65)

## 2018-11-23 ENCOUNTER — Other Ambulatory Visit: Payer: Self-pay

## 2018-11-23 ENCOUNTER — Other Ambulatory Visit: Payer: Self-pay | Admitting: Primary Care

## 2018-11-23 DIAGNOSIS — Z9109 Other allergy status, other than to drugs and biological substances: Secondary | ICD-10-CM

## 2018-11-23 MED ORDER — MONTELUKAST SODIUM 10 MG PO TABS
10.0000 mg | ORAL_TABLET | Freq: Every day | ORAL | 0 refills | Status: DC
Start: 2018-11-23 — End: 2018-11-24

## 2018-11-25 ENCOUNTER — Encounter: Payer: Self-pay | Admitting: *Deleted

## 2018-12-21 ENCOUNTER — Other Ambulatory Visit: Payer: Self-pay | Admitting: Primary Care

## 2018-12-21 DIAGNOSIS — G4700 Insomnia, unspecified: Secondary | ICD-10-CM

## 2018-12-23 DIAGNOSIS — J3489 Other specified disorders of nose and nasal sinuses: Secondary | ICD-10-CM

## 2018-12-23 MED ORDER — AMOXICILLIN-POT CLAVULANATE 875-125 MG PO TABS: 1.0000 | ORAL_TABLET | Freq: Two times a day (BID) | ORAL | 0 refills | Status: DC

## 2019-02-03 ENCOUNTER — Other Ambulatory Visit: Payer: Self-pay | Admitting: Primary Care

## 2019-02-03 DIAGNOSIS — F3342 Major depressive disorder, recurrent, in full remission: Secondary | ICD-10-CM

## 2019-02-03 MED ORDER — CITALOPRAM HYDROBROMIDE 10 MG PO TABS: 10.0000 mg | ORAL_TABLET | Freq: Every day | ORAL | 1 refills | Status: DC

## 2019-03-15 ENCOUNTER — Ambulatory Visit
Admission: RE | Admit: 2019-03-15 | Discharge: 2019-03-15 | Disposition: A | Discharge status: Home / Self Care | Payer: Managed Care, Other (non HMO) | Source: Ambulatory Visit | Attending: Primary Care | Admitting: Primary Care

## 2019-03-15 ENCOUNTER — Other Ambulatory Visit: Payer: Self-pay | Admitting: Primary Care

## 2019-03-15 DIAGNOSIS — N6489 Other specified disorders of breast: Secondary | ICD-10-CM

## 2019-03-15 DIAGNOSIS — Z1231 Encounter for screening mammogram for malignant neoplasm of breast: Secondary | ICD-10-CM | POA: Diagnosis present

## 2019-03-15 DIAGNOSIS — R928 Other abnormal and inconclusive findings on diagnostic imaging of breast: Secondary | ICD-10-CM

## 2019-03-23 ENCOUNTER — Ambulatory Visit
Admission: RE | Admit: 2019-03-23 | Discharge: 2019-03-23 | Disposition: A | Discharge status: Home / Self Care | Payer: Managed Care, Other (non HMO) | Source: Ambulatory Visit | Attending: Primary Care | Admitting: Primary Care

## 2019-03-23 DIAGNOSIS — R928 Other abnormal and inconclusive findings on diagnostic imaging of breast: Secondary | ICD-10-CM

## 2019-03-23 DIAGNOSIS — N6489 Other specified disorders of breast: Secondary | ICD-10-CM | POA: Diagnosis present

## 2019-03-25 ENCOUNTER — Telehealth: Payer: Self-pay

## 2019-03-25 NOTE — Telephone Encounter (Signed)
Noted.  Colonoscopy referral was closed.

## 2019-03-25 NOTE — Telephone Encounter (Signed)
Referral closed.  See Referral Notes/hx.  Pt has not responded to phone calls or Unable to Contact letter.  

## 2019-04-08 ENCOUNTER — Ambulatory Visit: Payer: Managed Care, Other (non HMO) | Attending: Internal Medicine

## 2019-04-08 DIAGNOSIS — Z23 Encounter for immunization: Secondary | ICD-10-CM

## 2019-04-08 NOTE — Progress Notes (Signed)
   Covid-19 Vaccination Clinic  Name:  Maureen Aguilar    MRN: [id number] DOB: [birth year]  04/08/2019  Ms. Fine was observed post Covid-19 immunization for 30 minutes based on pre-vaccination screening without incident. She was provided with Vaccine Information Sheet and instruction to access the V-Safe system.   [name] was instructed to call 911 with any severe reactions post vaccine: Marland Kitchen Difficulty breathing  . Swelling of face and throat  . A fast heartbeat  . A bad rash all over body  . Dizziness and weakness   Immunizations Administered    Name Date Dose VIS Date Route   Pfizer COVID-19 Vaccine 04/08/2019 10:46 AM 0.3 mL 12/24/2018 Intramuscular   Manufacturer: [place]   Lot: [id number]   [place]: [id number]

## 2019-04-08 NOTE — Progress Notes (Signed)
   Covid-19 Vaccination Clinic  Name:  Maureen Aguilar    MRN: [id number] DOB: September 07, 1975  04/08/2019  Maureen Aguilar was observed post Covid-19 immunization for 30 minutes based on pre-vaccination screening .  During the observation period, she experienced an adverse reaction with the following symptoms:  Numbness to left fingers,left  arm aches, .  Assessment : Time of assessment 1040am Alert and oriented. No redness or swelling, able to move fingers, reports pain 3?10.  Actions taken: VS @1040am  BP- 149/100, P- 81, O2- 99%, recheck at 1049 BP- 152/87, recheck @ 1131- 147/79, P- 77, O2-100%.     Medications administered: No medication administered.  Disposition: Reports no further symptoms of adverse reaction after observation for 30 minutes. Discharged home.   Immunizations Administered    Name Date Dose VIS Date Route   Pfizer COVID-19 Vaccine 04/08/2019 10:46 AM 0.3 mL 12/24/2018 Intramuscular   Manufacturer: [place]   Lot: [id number]   [place]: [id number]

## 2019-04-12 ENCOUNTER — Telehealth: Payer: Self-pay | Admitting: *Deleted

## 2019-04-12 NOTE — Telephone Encounter (Signed)
Patient called back and discussed her receiving the COVID vaccine. She stated that she did experience some numbness and tingling in her arm after the vaccine, but within two hours post vaccine the numbness and tingling had resided. She states that her arm is just really sore now and she believes that maybe the nurse hit a nerve when she was administering the injection. Patient still has every intention of receiving the second vaccine. Advised to patient that if she has any questions or concerns to please call. Patient verbalized understanding.

## 2019-04-12 NOTE — Telephone Encounter (Signed)
Called Maureen Aguilar and left a voicemail asking for the patient to return call regarding the COVID vaccine that she received on 04/08/2019 to see if she had any further symptoms or issues. Will attempt to call again.

## 2019-04-13 NOTE — Telephone Encounter (Signed)
I agree with the assessment and plan.  Salvatore Marvel, MD [facility]

## 2019-05-03 ENCOUNTER — Ambulatory Visit: Payer: Managed Care, Other (non HMO) | Attending: Internal Medicine

## 2019-05-03 DIAGNOSIS — Z23 Encounter for immunization: Secondary | ICD-10-CM

## 2019-05-03 NOTE — Progress Notes (Signed)
   Covid-19 Vaccination Clinic  Name:  Maureen Aguilar    MRN: [id number] DOB: August 26, 1975  05/03/2019  [name] was observed post Covid-19 immunization for 15 minutes without incident. She was provided with Vaccine Information Sheet and instruction to access the V-Safe system.   [name] was instructed to call 911 with any severe reactions post vaccine: Marland Kitchen Difficulty breathing  . Swelling of face and throat  . A fast heartbeat  . A bad rash all over body  . Dizziness and weakness   Immunizations Administered    Name Date Dose VIS Date Route   Pfizer COVID-19 Vaccine 05/03/2019  9:36 AM 0.3 mL 03/09/2018 Intramuscular   Manufacturer: [employer]   Lot: [id number]   [place]: [id number]

## 2019-06-18 ENCOUNTER — Other Ambulatory Visit: Payer: Self-pay | Admitting: Primary Care

## 2019-06-18 DIAGNOSIS — G4700 Insomnia, unspecified: Secondary | ICD-10-CM

## 2019-08-13 ENCOUNTER — Other Ambulatory Visit: Payer: Self-pay | Admitting: Primary Care

## 2019-08-13 DIAGNOSIS — F3342 Major depressive disorder, recurrent, in full remission: Secondary | ICD-10-CM

## 2019-09-30 ENCOUNTER — Other Ambulatory Visit: Payer: Self-pay | Admitting: Primary Care

## 2019-09-30 DIAGNOSIS — G4700 Insomnia, unspecified: Secondary | ICD-10-CM

## 2019-10-03 NOTE — Telephone Encounter (Signed)
LAST APPOINTMENT DATE: 12/13/2018   NEXT APPOINTMENT DATE: Visit date not found    LAST REFILL: 06/20/2019  QTY: 180 1 rf

## 2019-10-24 ENCOUNTER — Telehealth: Payer: Self-pay | Admitting: Primary Care

## 2019-10-24 NOTE — Telephone Encounter (Signed)
Form placed in your inbox

## 2019-10-24 NOTE — Telephone Encounter (Signed)
Patient dropped off an appeal form for her wellness.  Form is in rx tower. Please call patient to let her know when form is ready for pick up.

## 2019-10-24 NOTE — Telephone Encounter (Signed)
Form completed and placed in Joellen's inbox. 

## 2019-10-25 NOTE — Telephone Encounter (Signed)
Left message to return call to our office.  We need to know if she would like to have form picked up of mailed.

## 2019-10-27 NOTE — Telephone Encounter (Signed)
Pt would like to pick up forms

## 2019-10-28 NOTE — Telephone Encounter (Signed)
Copy sent to scan, original placed at reception for pick up. L/m to let patient know of voice mail.

## 2019-12-09 ENCOUNTER — Encounter: Payer: Self-pay | Admitting: Primary Care

## 2019-12-09 ENCOUNTER — Ambulatory Visit: Payer: Managed Care, Other (non HMO) | Admitting: Primary Care

## 2019-12-09 ENCOUNTER — Other Ambulatory Visit: Payer: Self-pay

## 2019-12-09 VITALS — BP 136/82 | HR 106 | Temp 97.0°F | Ht 66.0 in | Wt 206.0 lb

## 2019-12-09 DIAGNOSIS — Z1159 Encounter for screening for other viral diseases: Secondary | ICD-10-CM

## 2019-12-09 DIAGNOSIS — R21 Rash and other nonspecific skin eruption: Secondary | ICD-10-CM

## 2019-12-09 DIAGNOSIS — Z114 Encounter for screening for human immunodeficiency virus [HIV]: Secondary | ICD-10-CM | POA: Diagnosis not present

## 2019-12-09 MED ORDER — PREDNISONE 20 MG PO TABS: ORAL_TABLET | ORAL | 0 refills | Status: DC

## 2019-12-09 NOTE — Progress Notes (Signed)
Subjective:    Patient ID: Maureen Aguilar, female    DOB: 1975/05/29, 44 y.o.   MRN: [id number]  HPI  This visit occurred during the SARS-CoV-2 public health emergency.  Safety protocols were in place, including screening questions prior to the visit, additional usage of staff PPE, and extensive cleaning of exam room while observing appropriate contact time as indicated for disinfecting solutions.   Maureen Aguilar is a 44 year old female with a history of GERD, parathyroidectomy, chronic headaches, allergic rash who presents today with a chief complaint of skin sores.   She's noticed "sores" to her skin around the posterior and lateral neck, scalp, behind her ears for which she noticed about 8-9 months ago. She will rub her skin with her hand, feels a "grain of sand". Several days later she will then notice a raised, red sore which will remain for about one to two months before healing.   She's applied neosporin without improvement.   The sores are not itchy or painful. Her GYN changed her birth control prior to sores starting, is now only on progesterone due to elevations in blood pressure when on estrogen.   No new lotions, detergents, soaps or shampoos. No new vitamins, supplements. No new pets. No recent outdoor exposure or poison ivy exposure. No bonfire or smoke exposure.  No recent motel or hotel stay or new beds.   No fevers/chills, oral lesions, new joint pains, tick bites, abdominal pain, nausea.   BP Readings from Last 3 Encounters:  12/09/19 136/82  11/16/18 126/76  11/02/17 130/76     Review of Systems  Constitutional: Negative for fever.  Skin: Positive for rash.       See HPI       Past Medical History:  Diagnosis Date  . Depression   . Endometriosis   . Frequent headaches   . GERD (gastroesophageal reflux disease)   . History of kidney stones   . History of UTI   . Parathyroid abnormality [facility])      Social History   Socioeconomic History  . Marital  status: Single    Spouse name: Not on file  . Number of children: Not on file  . Years of education: Not on file  . Highest education level: Not on file  Occupational History  . Not on file  Tobacco Use  . Smoking status: Never Smoker  . Smokeless tobacco: Never Used  Substance and Sexual Activity  . Alcohol use: No    Alcohol/week: 0.0 standard drinks  . Drug use: Not on file  . Sexual activity: Not on file  Other Topics Concern  . Not on file  Social History Narrative   Single.   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   Highest level of education 12th grade.   Enjoys reading, driving.   Social Determinants of Health   Financial Resource Strain:   . Difficulty of Paying Living Expenses: Not on file  Food Insecurity:   . Worried About Charity fundraiser in the Last Year: Not on file  . Ran Out of Food in the Last Year: Not on file  Transportation Needs:   . Lack of Transportation (Medical): Not on file  . Lack of Transportation (Non-Medical): Not on file  Physical Activity:   . Days of Exercise per Week: Not on file  . Minutes of Exercise per Session: Not on file  Stress:   . Feeling of Stress :  Not on file  Social Connections:   . Frequency of Communication with Friends and Family: Not on file  . Frequency of Social Gatherings with Friends and Family: Not on file  . Attends Religious Services: Not on file  . Active Member of Clubs or Organizations: Not on file  . Attends Archivist Meetings: Not on file  . Marital Status: Not on file  Intimate Partner Violence:   . Fear of Current or Ex-Partner: Not on file  . Emotionally Abused: Not on file  . Physically Abused: Not on file  . Sexually Abused: Not on file    Past Surgical History:  Procedure Laterality Date  . 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 EXCISION OF ENDOMETRIOMA  2014  . LITHOTRIPSY  2014  . PARATHYROIDECTOMY  2014   removal of one.    Family  History  Problem Relation Age of Onset  . Alcohol abuse Mother   . Arthritis Mother   . Mental illness Mother   . Mental illness Brother        bipolar  . Cancer Maternal Aunt        breast  . Breast cancer Maternal Aunt 29  . Mental illness Maternal Grandmother   . Asthma Maternal Grandmother   . Hyperlipidemia Maternal Grandmother   . Hypertension Maternal Grandmother   . Heart disease Maternal Grandfather     No Known Allergies  Current Outpatient Medications on File Prior to Visit  Medication Sig Dispense Refill  . citalopram (CELEXA) 10 MG tablet TAKE 1 TABLET(10 MG) BY MOUTH DAILY 90 tablet 1  . montelukast (SINGULAIR) 10 MG tablet TAKE 1 TABLET(10 MG) BY MOUTH AT BEDTIME FOR ALLERGIES 90 tablet 0  . norethindrone (MICRONOR) 0.35 MG tablet Take 1 tablet by mouth daily.    . traZODone (DESYREL) 50 MG tablet TAKE 1 TO 2 TABLETS BY MOUTH AT BEDTIME AS NEEDED FOR SLEEP 180 tablet 1   No current facility-administered medications on file prior to visit.    BP 136/82   Pulse (!) 106   Temp (!) 97 F (36.1 C) (Temporal)   Ht 5\' 6"  (1.676 m)   Wt 206 lb (93.4 kg)   SpO2 98%   BMI 33.25 kg/m    Objective:   Physical Exam Pulmonary:     Effort: Pulmonary effort is normal.  Skin:    General: Skin is warm and dry.     Comments: Numerous erythematous, circular, raised, sores without drainage to posterior neck, lateral neck, right cheek, and scalp. Non tender. No surrounding erythema or warmth.   Neurological:     Mental Status: She is alert.  Psychiatric:        Mood and Affect: Mood normal.            Assessment & Plan:

## 2019-12-09 NOTE — Patient Instructions (Signed)
Go to [facility] for labs as discussed.  Start prednisone. Take 2 tablets daily for four days, then 1 tablet daily for four days.  You will be contacted regarding your referral to dermatology.  Please let us know if you have not been contacted within two weeks.   It was a pleasure to see you today!

## 2019-12-09 NOTE — Assessment & Plan Note (Addendum)
Chronic for 8-9 months. Unclear if these are due to metabolic cause or hormonal cause given change in OCP's just prior to symptoms starting.   Checking labs today to rule out infection, metabolic cause.   Rx for prednisone sent to pharmacy. Referral placed to dermatology given duration of symptoms.

## 2019-12-13 LAB — TSH: TSH: 0.513 u[IU]/mL (ref 0.450–4.500)

## 2019-12-13 LAB — CBC
Hematocrit: 37 % (ref 34.0–46.6)
Hemoglobin: 12.3 g/dL (ref 11.1–15.9)
MCH: 27.1 pg (ref 26.6–33.0)
MCHC: 33.2 g/dL (ref 31.5–35.7)
MCV: 82 fL (ref 79–97)
Platelets: 302 10*3/uL (ref 150–450)
RBC: 4.54 x10E6/uL (ref 3.77–5.28)
RDW: 13.3 % (ref 11.7–15.4)
WBC: 12.9 10*3/uL — ABNORMAL HIGH (ref 3.4–10.8)

## 2019-12-13 LAB — BASIC METABOLIC PANEL
BUN/Creatinine Ratio: 13 (ref 9–23)
BUN: 14 mg/dL (ref 6–24)
CO2: 24 mmol/L (ref 20–29)
Calcium: 9.9 mg/dL (ref 8.7–10.2)
Chloride: 103 mmol/L (ref 96–106)
Creatinine, Ser: 1.04 mg/dL — ABNORMAL HIGH (ref 0.57–1.00)
GFR calc Af Amer: 74 mL/min/{1.73_m2} (ref >59)
GFR calc non Af Amer: 65 mL/min/{1.73_m2} (ref >59)
Glucose: 121 mg/dL — ABNORMAL HIGH (ref 65–99)
Potassium: 4.6 mmol/L (ref 3.5–5.2)
Sodium: 141 mmol/L (ref 134–144)

## 2019-12-13 LAB — HEMOGLOBIN A1C
Est. average glucose Bld gHb Est-mCnc: 114 mg/dL
Hgb A1c MFr Bld: 5.6 % (ref 4.8–5.6)

## 2019-12-13 LAB — ANA: Anti Nuclear Antibody (ANA): NEGATIVE

## 2019-12-13 LAB — HEPATITIS C ANTIBODY: Hep C Virus Ab: 0.2 s/co ratio (ref 0.0–0.9)

## 2019-12-13 LAB — HIV ANTIBODY (ROUTINE TESTING W REFLEX): HIV Screen 4th Generation wRfx: NONREACTIVE

## 2019-12-21 ENCOUNTER — Ambulatory Visit: Payer: Managed Care, Other (non HMO) | Admitting: Dermatology

## 2019-12-21 ENCOUNTER — Other Ambulatory Visit: Payer: Self-pay

## 2019-12-21 DIAGNOSIS — D485 Neoplasm of uncertain behavior of skin: Secondary | ICD-10-CM

## 2019-12-21 MED ORDER — MUPIROCIN 2 % EX OINT: 1.0000 "application " | TOPICAL_OINTMENT | Freq: Two times a day (BID) | CUTANEOUS | 0 refills | Status: DC

## 2019-12-21 NOTE — Progress Notes (Signed)
   New Patient Visit  Subjective  Maureen Aguilar is a 44 y.o. female who presents for the following: Rash (post neck and upper back x few weeks. Was given a prednisone taper by primary care (4 days of high dose and 4 days of low dose) and that helped a little bit.).  Referred by Alma Friendly, NP  The following portions of the chart were reviewed this encounter and updated as appropriate:   Tobacco  Allergies  Meds  Problems  Med Hx  Surg Hx  Fam Hx     Review of Systems:  No other skin or systemic complaints except as noted in HPI or Assessment and Plan.  Objective  Well appearing patient in no apparent distress; mood and affect are within normal limits.  A focused examination was performed including neck, upper back. Relevant physical exam findings are noted in the Assessment and Plan.  Objective  left neck: 0.5-1.0 cm pink papules with central erosion of post neck and upper back        Assessment & Plan  Rash - persistent for weeks with new lesions forming. Viral Exanthem vs other  left neck  Skin / nail biopsy Type of biopsy: tangential   Informed consent: discussed and consent obtained   Timeout: patient name, date of birth, surgical site, and procedure verified   Procedure prep:  Patient was prepped and draped in usual sterile fashion Prep type:  Isopropyl alcohol Anesthesia: the lesion was anesthetized in a standard fashion   Anesthetic:  1% lidocaine w/ epinephrine 1-100,000 buffered w/ 8.4% NaHCO3 Instrument used: flexible razor blade   Hemostasis achieved with: pressure, aluminum chloride and electrodesiccation   Outcome: patient tolerated procedure well   Post-procedure details: sterile dressing applied and wound care instructions given   Dressing type: bandage and petrolatum    mupirocin ointment (BACTROBAN) 2 %  Anatomic Pathology Report  Viral exanthem vs other - no hx of diabetes or kidney disease  Mupirocin oint bid  Return pending  biopsy results.  I, Ashok Cordia, CMA, am acting as scribe for Sarina Ser, MD .  Documentation: I have reviewed the above documentation for accuracy and completeness, and I agree with the above.  Sarina Ser, MD

## 2019-12-21 NOTE — Patient Instructions (Signed)

## 2019-12-27 ENCOUNTER — Encounter: Payer: Self-pay | Admitting: Dermatology

## 2020-01-02 ENCOUNTER — Telehealth: Payer: Self-pay

## 2020-01-02 ENCOUNTER — Encounter: Payer: Self-pay | Admitting: Primary Care

## 2020-01-02 ENCOUNTER — Ambulatory Visit: Payer: Managed Care, Other (non HMO) | Admitting: Primary Care

## 2020-01-02 ENCOUNTER — Other Ambulatory Visit: Payer: Self-pay

## 2020-01-02 VITALS — BP 124/68 | HR 83 | Temp 97.4°F | Ht 66.0 in | Wt 209.0 lb

## 2020-01-02 DIAGNOSIS — G44229 Chronic tension-type headache, not intractable: Secondary | ICD-10-CM

## 2020-01-02 DIAGNOSIS — R21 Rash and other nonspecific skin eruption: Secondary | ICD-10-CM

## 2020-01-02 DIAGNOSIS — Z0000 Encounter for general adult medical examination without abnormal findings: Secondary | ICD-10-CM

## 2020-01-02 DIAGNOSIS — F3342 Major depressive disorder, recurrent, in full remission: Secondary | ICD-10-CM | POA: Diagnosis not present

## 2020-01-02 DIAGNOSIS — G4700 Insomnia, unspecified: Secondary | ICD-10-CM

## 2020-01-02 DIAGNOSIS — Z9109 Other allergy status, other than to drugs and biological substances: Secondary | ICD-10-CM

## 2020-01-02 DIAGNOSIS — K219 Gastro-esophageal reflux disease without esophagitis: Secondary | ICD-10-CM | POA: Diagnosis not present

## 2020-01-02 NOTE — Progress Notes (Signed)
Subjective:    Patient ID: Maureen Aguilar, female    DOB: [birth year], 44 y.o.   MRN: [id number]  HPI  This visit occurred during the SARS-CoV-2 public health emergency.  Safety protocols were in place, including screening questions prior to the visit, additional usage of staff PPE, and extensive cleaning of exam room while observing appropriate contact time as indicated for disinfecting solutions.   Maureen Aguilar is a 44 year old female who presents today for complete physical.  Immunizations: -Tetanus: Completed in 2016 -Influenza: Completed this season  -Covid-19: Completed series   Diet: She endorses a fair diet.  Exercise: She is walking.  Eye exam: Completes annually Dental exam: Completes semi-annually   Pap Smear: Completed in 2019 Mammogram: UTD Colonoscopy: Never completed, insurance will not cover until 59.  Wt Readings from Last 3 Encounters:  01/02/20 209 lb (94.8 kg)  12/09/19 206 lb (93.4 kg)  11/16/18 194 lb 8 oz (88.2 kg)   BP Readings from Last 3 Encounters:  01/02/20 124/68  12/09/19 136/82  11/16/18 126/76     Review of Systems  Constitutional: Negative for unexpected weight change.  HENT: Negative for rhinorrhea.   Eyes: Negative for visual disturbance.  Respiratory: Negative for cough and shortness of breath.   Cardiovascular: Negative for chest pain.  Gastrointestinal: Negative for constipation and diarrhea.  Genitourinary: Negative for difficulty urinating.  Musculoskeletal: Negative for arthralgias and myalgias.  Skin: Negative for rash.  Allergic/Immunologic: Positive for environmental allergies.  Neurological: Negative for dizziness, numbness and headaches.  Psychiatric/Behavioral: The patient is not nervous/anxious.        Past Medical History:  Diagnosis Date  . Depression   . Endometriosis   . Frequent headaches   . GERD (gastroesophageal reflux disease)   . History of kidney stones   . History of UTI   . 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 Marital status: Single    Spouse name: Not on file  . Number of children: Not on file  . Years of education: Not on file  . Highest education level: Not on file  Occupational History  . Not on file  Tobacco Use  . Smoking status: Never Smoker  . Smokeless tobacco: Never Used  Substance and Sexual Activity  . Alcohol use: No    Alcohol/week: 0.0 standard drinks  . Drug use: Not on file  . Sexual activity: Not on file  Other Topics Concern  . Not on file  Social History Narrative   Single.   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   Highest level of education 12th grade.   Enjoys reading, driving.   Social Determinants of Health   Financial Resource Strain: Not on file  Food Insecurity: Not on file  Transportation Needs: Not on file  Physical Activity: Not on file  Stress: Not on file  Social Connections: Not on file  Intimate Partner Violence: Not on file    Past Surgical History:  Procedure Laterality Date  . 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 EXCISION OF ENDOMETRIOMA  2014  . LITHOTRIPSY  2014  . PARATHYROIDECTOMY  2014   removal of one.    Family History  Problem Relation Age of Onset  . Alcohol abuse Mother   . Arthritis Mother   . Mental illness Mother   . Mental illness Brother  bipolar  . Cancer Maternal Aunt        breast  . Breast cancer Maternal Aunt 29  . Mental illness Maternal Grandmother   . Asthma Maternal Grandmother   . Hyperlipidemia Maternal Grandmother   . Hypertension Maternal Grandmother   . Heart disease Maternal Grandfather     No Known Allergies  Current Outpatient Medications on File Prior to Visit  Medication Sig Dispense Refill  . citalopram (CELEXA) 10 MG tablet TAKE 1 TABLET(10 MG) BY MOUTH DAILY 90 tablet 1  . mupirocin ointment (BACTROBAN) 2 % Apply 1 application topically 2 (two) times daily. 22 g 0  .  norethindrone (NORLYDA) 0.35 MG tablet Take 1 tablet by mouth daily.    Marland Kitchen omeprazole (PRILOSEC) 20 MG capsule Take 40 mg by mouth daily.    . traZODone (DESYREL) 50 MG tablet TAKE 1 TO 2 TABLETS BY MOUTH AT BEDTIME AS NEEDED FOR SLEEP 180 tablet 1   No current facility-administered medications on file prior to visit.    BP 124/68   Pulse 83   Temp (!) 97.4 F (36.3 C) (Temporal)   Ht 5\' 6"  (1.676 m)   Wt 209 lb (94.8 kg)   SpO2 99%   BMI 33.73 kg/m    Objective:   Physical Exam Constitutional:      Appearance: She is well-nourished.  HENT:     Right Ear: Tympanic membrane and ear canal normal.     Left Ear: Tympanic membrane and ear canal normal.     Mouth/Throat:     Mouth: Oropharynx is clear and moist.  Eyes:     Extraocular Movements: EOM normal.     Pupils: Pupils are equal, round, and reactive to light.  Cardiovascular:     Rate and Rhythm: Normal rate and regular rhythm.  Pulmonary:     Effort: Pulmonary effort is normal.     Breath sounds: Normal breath sounds.  Abdominal:     General: Bowel sounds are normal.     Palpations: Abdomen is soft.     Tenderness: There is no abdominal tenderness.  Musculoskeletal:        General: Normal range of motion.     Cervical back: Neck supple.  Skin:    General: Skin is warm and dry.  Neurological:     Mental Status: She is alert and oriented to person, place, and time.     Cranial Nerves: No cranial nerve deficit.     Deep Tendon Reflexes:     Reflex Scores:      Patellar reflexes are 2+ on the right side and 2+ on the left side. Psychiatric:        Mood and Affect: Mood and affect and mood normal.            Assessment & Plan:

## 2020-01-02 NOTE — Telephone Encounter (Signed)
Left message on voicemail to return my call.  

## 2020-01-02 NOTE — Assessment & Plan Note (Signed)
Doing well on citalopram 10 mg, continue same.  

## 2020-01-02 NOTE — Patient Instructions (Signed)
Stop by the lab prior to leaving today. I will notify you of your results once received.   Start exercising. You should be getting 150 minutes of moderate intensity exercise weekly.  Continue to work on a healthy diet. Ensure you are consuming 64 ounces of water daily.  Add in famotidine 20 mg once daily for heartburn. Continue taking omeprazole 40 mg daily. Please update me.  It was a pleasure to see you today!   Preventive Care 22-46 Years Old, Female Preventive care refers to visits with your health care provider and lifestyle choices that can promote health and wellness. This includes:  A yearly physical exam. This may also be called an annual well check.  Regular dental visits and eye exams.  Immunizations.  Screening for certain conditions.  Healthy lifestyle choices, such as eating a healthy diet, getting regular exercise, not using drugs or products that contain nicotine and tobacco, and limiting alcohol use. What can I expect for my preventive care visit? Physical exam Your health care provider will check your:  Height and weight. This may be used to calculate body mass index (BMI), which tells if you are at a healthy weight.  Heart rate and blood pressure.  Skin for abnormal spots. Counseling Your health care provider may ask you questions about your:  Alcohol, tobacco, and drug use.  Emotional well-being.  Home and relationship well-being.  Sexual activity.  Eating habits.  Work and work Statistician.  Method of birth control.  Menstrual cycle.  Pregnancy history. What immunizations do I need?  Influenza (flu) vaccine  This is recommended every year. Tetanus, diphtheria, and pertussis (Tdap) vaccine  You may need a Td booster every 10 years. Varicella (chickenpox) vaccine  You may need this if you have not been vaccinated. Zoster (shingles) vaccine  You may need this after age 52. Measles, mumps, and rubella (MMR) vaccine  You may need at  least one dose of MMR if you were born in 1957 or later. You may also need a second dose. Pneumococcal conjugate (PCV13) vaccine  You may need this if you have certain conditions and were not previously vaccinated. Pneumococcal polysaccharide (PPSV23) vaccine  You may need one or two doses if you smoke cigarettes or if you have certain conditions. Meningococcal conjugate (MenACWY) vaccine  You may need this if you have certain conditions. Hepatitis A vaccine  You may need this if you have certain conditions or if you travel or work in places where you may be exposed to hepatitis A. Hepatitis B vaccine  You may need this if you have certain conditions or if you travel or work in places where you may be exposed to hepatitis B. Haemophilus influenzae type b (Hib) vaccine  You may need this if you have certain conditions. Human papillomavirus (HPV) vaccine  If recommended by your health care provider, you may need three doses over 6 months. You may receive vaccines as individual doses or as more than one vaccine together in one shot (combination vaccines). Talk with your health care provider about the risks and benefits of combination vaccines. What tests do I need? Blood tests  Lipid and cholesterol levels. These may be checked every 5 years, or more frequently if you are over 9 years old.  Hepatitis C test.  Hepatitis B test. Screening  Lung cancer screening. You may have this screening every year starting at age 92 if you have a 30-pack-year history of smoking and currently smoke or have quit within the past  15 years.  Colorectal cancer screening. All adults should have this screening starting at age 69 and continuing until age 47. Your health care provider may recommend screening at age 60 if you are at increased risk. You will have tests every 1-10 years, depending on your results and the type of screening test.  Diabetes screening. This is done by checking your blood sugar  (glucose) after you have not eaten for a while (fasting). You may have this done every 1-3 years.  Mammogram. This may be done every 1-2 years. Talk with your health care provider about when you should start having regular mammograms. This may depend on whether you have a family history of breast cancer.  BRCA-related cancer screening. This may be done if you have a family history of breast, ovarian, tubal, or peritoneal cancers.  Pelvic exam and Pap test. This may be done every 3 years starting at age 13. Starting at age 48, this may be done every 5 years if you have a Pap test in combination with an HPV test. Other tests  Sexually transmitted disease (STD) testing.  Bone density scan. This is done to screen for osteoporosis. You may have this scan if you are at high risk for osteoporosis. Follow these instructions at home: Eating and drinking  Eat a diet that includes fresh fruits and vegetables, whole grains, lean protein, and low-fat dairy.  Take vitamin and mineral supplements as recommended by your health care provider.  Do not drink alcohol if: ? Your health care provider tells you not to drink. ? You are pregnant, may be pregnant, or are planning to become pregnant.  If you drink alcohol: ? Limit how much you have to 0-1 drink a day. ? Be aware of how much alcohol is in your drink. In the [place] one drink equals one 12 oz bottle of beer (355 mL), one 5 oz glass of wine (148 mL), or one 1 oz glass of hard liquor (44 mL). Lifestyle  Take daily care of your teeth and gums.  Stay active. Exercise for at least 30 minutes on 5 or more days each week.  Do not use any products that contain nicotine or tobacco, such as cigarettes, e-cigarettes, and chewing tobacco. If you need help quitting, ask your health care provider.  If you are sexually active, practice safe sex. Use a condom or other form of birth control (contraception) in order to prevent pregnancy and STIs (sexually  transmitted infections).  If told by your health care provider, take low-dose aspirin daily starting at age 50. What's next?  Visit your health care provider once a year for a well check visit.  Ask your health care provider how often you should have your eyes and teeth checked.  Stay up to date on all vaccines. This information is not intended to replace advice given to you by your health care provider. Make sure you discuss any questions you have with your health care provider. Document Revised: 09/10/2017 Document Reviewed: 09/10/2017 Elsevier Patient Education  2020 [employer].

## 2020-01-02 NOTE — Telephone Encounter (Signed)
Discussed with pt biopsy from neck showed benign inflamed keratosis. These may be present and getting really red and inflamed causing these bumps. If we can see before they get inflamed, they can be treated with LN2 and they will resolve.   She can make an appt for future if persistent spots. If they resolve and no new ones, we can just see as needed.

## 2020-01-02 NOTE — Telephone Encounter (Signed)
-----   Message from Ralene Bathe, MD sent at 12/29/2019 12:55 PM EST ----- Maureen Aguilar - I do not know how to sign this pts path off since its scanned in.  I have no way to go to a results page to sign off.  Caryl Pina and [place] - Please call pt and advise her that the biopsy from neck showed benign inflamed keratosis.  These may be present and getting really red and inflamed causing these bumps.  If we can see before they get inflamed, they can be treated with LN2 and they will resolve.   She can make an appt for future if persistent spots.  If they resolve and no new ones, we can just see as needed.  Please document above discussion with pt since no Result tab showing. thanks

## 2020-01-02 NOTE — Assessment & Plan Note (Signed)
Denies concerns for headaches. Continue to monitor.

## 2020-01-02 NOTE — Assessment & Plan Note (Addendum)
Chronic, worse recently with sometimes waking from sleep despite omeprazole 40 mg. Will add in famotidine 20 mg in AM, continue omeprazole 40 mg in PM. She will update.   Consider pantoprazole if needed.

## 2020-01-02 NOTE — Assessment & Plan Note (Signed)
Chronic history of insomnia, takes Trazodone a few times weekly. Overall stable. Continue to monitor.

## 2020-01-02 NOTE — Assessment & Plan Note (Signed)
Irritated benign keratosis per dermatology.

## 2020-01-02 NOTE — Assessment & Plan Note (Signed)
Chronic, year round. Not bothersome.   Singulair caused headaches, no improvement with OTC antihistamines and nasal sprays.  Continue to monitor.

## 2020-01-03 LAB — COMPREHENSIVE METABOLIC PANEL
ALT: 14 IU/L (ref 0–32)
AST: 20 IU/L (ref 0–40)
Albumin/Globulin Ratio: 1.5 (ref 1.2–2.2)
Albumin: 4.1 g/dL (ref 3.8–4.8)
Alkaline Phosphatase: 93 IU/L (ref 44–121)
BUN/Creatinine Ratio: 19 (ref 9–23)
BUN: 16 mg/dL (ref 6–24)
Bilirubin Total: 0.4 mg/dL (ref 0.0–1.2)
CO2: 23 mmol/L (ref 20–29)
Calcium: 9.4 mg/dL (ref 8.7–10.2)
Chloride: 106 mmol/L (ref 96–106)
Creatinine, Ser: 0.85 mg/dL (ref 0.57–1.00)
GFR calc Af Amer: 95 mL/min/{1.73_m2} (ref >59)
GFR calc non Af Amer: 82 mL/min/{1.73_m2} (ref >59)
Globulin, Total: 2.7 g/dL (ref 1.5–4.5)
Glucose: 96 mg/dL (ref 65–99)
Potassium: 4.4 mmol/L (ref 3.5–5.2)
Sodium: 141 mmol/L (ref 134–144)
Total Protein: 6.8 g/dL (ref 6.0–8.5)

## 2020-01-03 LAB — LIPID PANEL
Chol/HDL Ratio: 4.6 ratio — ABNORMAL HIGH (ref 0.0–4.4)
Cholesterol, Total: 215 mg/dL — ABNORMAL HIGH (ref 100–199)
HDL: 47 mg/dL (ref >39)
LDL Chol Calc (NIH): 149 mg/dL — ABNORMAL HIGH (ref 0–99)
Triglycerides: 107 mg/dL (ref 0–149)
VLDL Cholesterol Cal: 19 mg/dL (ref 5–40)

## 2020-07-15 ENCOUNTER — Other Ambulatory Visit: Payer: Self-pay | Admitting: Internal Medicine

## 2020-07-15 DIAGNOSIS — G4700 Insomnia, unspecified: Secondary | ICD-10-CM

## 2020-09-24 ENCOUNTER — Other Ambulatory Visit: Payer: Self-pay

## 2020-09-24 ENCOUNTER — Telehealth: Payer: Self-pay

## 2020-09-24 ENCOUNTER — Ambulatory Visit
Admission: RE | Admit: 2020-09-24 | Discharge: 2020-09-24 | Disposition: A | Discharge status: Home / Self Care | Payer: Managed Care, Other (non HMO) | Source: Ambulatory Visit

## 2020-09-24 ENCOUNTER — Ambulatory Visit (INDEPENDENT_AMBULATORY_CARE_PROVIDER_SITE_OTHER): Payer: Managed Care, Other (non HMO)

## 2020-09-24 VITALS — BP 155/90 | HR 81 | Temp 99.0°F | Resp 18 | Ht 66.0 in | Wt 209.0 lb

## 2020-09-24 DIAGNOSIS — R079 Chest pain, unspecified: Secondary | ICD-10-CM | POA: Diagnosis not present

## 2020-09-24 DIAGNOSIS — M549 Dorsalgia, unspecified: Secondary | ICD-10-CM | POA: Diagnosis not present

## 2020-09-24 DIAGNOSIS — R0781 Pleurodynia: Secondary | ICD-10-CM

## 2020-09-24 DIAGNOSIS — K649 Unspecified hemorrhoids: Secondary | ICD-10-CM

## 2020-09-24 LAB — URINALYSIS, COMPLETE (UACMP) WITH MICROSCOPIC
Bilirubin Urine: NEGATIVE
Glucose, UA: NEGATIVE mg/dL
Leukocytes,Ua: NEGATIVE
Nitrite: NEGATIVE
Protein, ur: NEGATIVE mg/dL
Specific Gravity, Urine: >1.03 — ABNORMAL HIGH (ref 1.005–1.030)
pH: 5.5 (ref 5.0–8.0)

## 2020-09-24 MED ORDER — PREDNISONE 10 MG PO TABS: ORAL_TABLET | ORAL | 0 refills | Status: DC

## 2020-09-24 MED ORDER — CYCLOBENZAPRINE HCL 10 MG PO TABS: 10.0000 mg | ORAL_TABLET | Freq: Three times a day (TID) | ORAL | 0 refills | Status: DC | PRN

## 2020-09-24 NOTE — ED Provider Notes (Signed)
[facility]    CSN: [id number] Arrival date & time: 09/24/20  1043      History   Chief Complaint Chief Complaint  Patient presents with   Back Pain   Appointment    HPI Maureen Aguilar is a 45 y.o. female presenting for intermittent left sided burning pain of the upper back pain x 1 week. Denies any injuries. Pain worse with laying on affected side and with deep breathing.  Pain does seem to be a little worse with extension of her back.  Denies any pain to touch.  No fever, cough, chest pain, SOB, wheezing, abdominal pain, nausea/vomiting, skin rashes, urinary symptoms.  No similar problems in the past.  Patient has taken ibuprofen and says it did not the pain.  She has also applied heat.  Patient denies any recent surgeries.  She does take oral hormones as well as has an IUD.  Patient denies any recent surgeries and no history of DVT/PE or clotting disorders.  No other complaints.  HPI  Past Medical History:  Diagnosis Date   Depression    Endometriosis    Frequent headaches    GERD (gastroesophageal reflux disease)    History of kidney stones    History of UTI    Parathyroid abnormality [facility])     Patient Active Problem List   Diagnosis Date Noted   Environmental allergies 11/16/2018   History of parathyroidectomy ([place]) 11/16/2018   Insomnia 06/04/2017   Rash and nonspecific skin eruption 04/28/2017   Elevated blood pressure reading 10/30/2016   Chronic midline low back pain without sciatica 10/15/2015   Preventative health care 08/21/2014   Depression 06/26/2014   GERD (gastroesophageal reflux disease) 06/26/2014   Chronic headaches 06/26/2014    Past Surgical History:  Procedure Laterality Date   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    OB History   No obstetric history on file.      Home Medications    Prior to Admission  medications   Medication Sig Start Date End Date Taking? Authorizing Provider  citalopram (CELEXA) 10 MG tablet TAKE 1 TABLET(10 MG) BY MOUTH DAILY 08/16/19  Yes Pleas Koch, NP  cyclobenzaprine (FLEXERIL) 10 MG tablet Take 1 tablet (10 mg total) by mouth 3 (three) times daily as needed for muscle spasms. 09/24/20  Yes Danton Clap, PA-C  levonorgestrel (MIRENA) 20 MCG/DAY IUD 1 each by Intrauterine route once.   Yes [provider]  norethindrone (MICRONOR) 0.35 MG tablet Take 1 tablet by mouth daily.   Yes [provider]  omeprazole (PRILOSEC) 20 MG capsule Take 40 mg by mouth daily.   Yes [provider]  predniSONE (DELTASONE) 10 MG tablet Take 6 tabs PO day 1 and decrease by 1 tab daily until complete 09/24/20  Yes Laurene Footman B, PA-C  traZODone (DESYREL) 50 MG tablet TAKE 1 TO 2 TABLETS BY MOUTH AT BEDTIME AS NEEDED FOR SLEEP 07/18/20  Yes Pleas Koch, NP  mupirocin ointment (BACTROBAN) 2 % Apply 1 application topically 2 (two) times daily. 12/21/19   Ralene Bathe, MD    Family History Family History  Problem Relation Age of Onset   Alcohol abuse Mother    Arthritis Mother    Mental illness Mother    Mental illness Brother  bipolar   Cancer Maternal Aunt        breast   Breast cancer Maternal Aunt 29   Mental illness Maternal Grandmother    Asthma Maternal Grandmother    Hyperlipidemia Maternal Grandmother    Hypertension Maternal Grandmother    Heart disease Maternal Grandfather     Social History Social History   Tobacco Use   Smoking status: Never   Smokeless tobacco: Never  Vaping Use   Vaping Use: Never used  Substance Use Topics   Alcohol use: No    Alcohol/week: 0.0 standard drinks   Drug use: Not Currently     Allergies   Patient has no known allergies.   Review of Systems Review of Systems  Constitutional:  Negative for chills, diaphoresis, fatigue and fever.  HENT:  Negative for congestion,  rhinorrhea and sore throat.   Respiratory:  Negative for cough and shortness of breath.   Cardiovascular:  Negative for chest pain and palpitations.  Gastrointestinal:  Negative for abdominal pain, nausea and vomiting.  Genitourinary:  Negative for dysuria and hematuria.  Musculoskeletal:  Positive for back pain. Negative for arthralgias and myalgias.  Skin:  Negative for rash.  Neurological:  Negative for weakness and headaches.    Physical Exam Triage Vital Signs ED Triage Vitals  Enc Vitals Group     BP 09/24/20 1134 (!) 155/90     Pulse Rate 09/24/20 1134 81     Resp 09/24/20 1134 18     Temp 09/24/20 1134 99 F (37.2 C)     Temp Source 09/24/20 1134 Oral     SpO2 09/24/20 1134 99 %     Weight 09/24/20 1132 208 lb 15.9 oz (94.8 kg)     Height 09/24/20 1132 '5\' 6"'$  (1.676 m)     Head Circumference --      Peak Flow --      Pain Score 09/24/20 1131 5     Pain Loc --      Pain Edu? --      Excl. in [place]? --    No data found.  Updated Vital Signs BP (!) 155/90 (BP Location: Right Arm)   Pulse 81   Temp 99 F (37.2 C) (Oral)   Resp 18   Ht '5\' 6"'$  (1.676 m)   Wt 208 lb 15.9 oz (94.8 kg)   SpO2 99%   BMI 33.73 kg/m      Physical Exam Vitals and nursing note reviewed.  Constitutional:      General: She is not in acute distress.    Appearance: Normal appearance. She is not ill-appearing or toxic-appearing.  HENT:     Head: Normocephalic and atraumatic.     Nose: Nose normal.     Mouth/Throat:     Mouth: Mucous membranes are moist.     Pharynx: Oropharynx is clear.  Eyes:     General: No scleral icterus.       Right eye: No discharge.        Left eye: No discharge.     Conjunctiva/sclera: Conjunctivae normal.  Cardiovascular:     Rate and Rhythm: Normal rate and regular rhythm.     Heart sounds: Normal heart sounds.  Pulmonary:     Effort: Pulmonary effort is normal. No respiratory distress.     Breath sounds: Normal breath sounds.  Abdominal:     Palpations:  Abdomen is soft.     Tenderness: There is no abdominal tenderness. There is left CVA tenderness. There  is no right CVA tenderness.  Musculoskeletal:     Cervical back: Neck supple.     Thoracic back: Tenderness (Pain with deep palpation as shown in picture, left thoracolumbar region) present. No bony tenderness. Normal range of motion.     Lumbar back: No tenderness. Normal range of motion.       Back:  Skin:    General: Skin is dry.  Neurological:     General: No focal deficit present.     Mental Status: She is alert. Mental status is at baseline.     Motor: No weakness.     Gait: Gait normal.  Psychiatric:        Mood and Affect: Mood normal.        Behavior: Behavior normal.        Thought Content: Thought content normal.     UC Treatments / Results  Labs (all labs ordered are listed, but only abnormal results are displayed) Labs Reviewed  URINALYSIS, COMPLETE (UACMP) WITH MICROSCOPIC - Abnormal; Notable for the following components:      Result Value   Specific Gravity, Urine >1.030 (*)    Hgb urine dipstick TRACE (*)    Ketones, ur TRACE (*)    Bacteria, UA FEW (*)    All other components within normal limits    EKG   Radiology DG Ribs Unilateral W/Chest Left  Result Date: 09/24/2020 CLINICAL DATA:  Left-sided chest and rib pain with inspiration. Symptoms began about a week ago. EXAM: LEFT RIBS AND CHEST - 3+ VIEW COMPARISON:  05/28/2015 FINDINGS: Heart size is normal. Mediastinal shadows are normal. The lungs are clear. No pneumothorax or hemothorax. Left-sided rib films with marker in the region of concern do not show any rib fracture or focal rib lesion. No evidence of spinal abnormality. IMPRESSION: Negative study. No active cardiopulmonary disease. No visible left rib pathology. Electronically Signed   By: Nelson Chimes M.D.   On: 09/24/2020 12:36    Procedures Procedures (including critical care time)  Medications Ordered in UC Medications - No data to  display  Initial Impression / Assessment and Plan / UC Course  I have reviewed the triage vital signs and the nursing notes.  Pertinent labs & imaging results that were available during my care of the patient were reviewed by me and considered in my medical decision making (see chart for details).  45 year old female presenting for left sided thoracolumbar pain which is worse with laying on affected side and taking a deep breath x1 week.  Pain not worsening.  No improvement with ibuprofen or heat.  No other associated symptoms.  Vitals are stable but blood pressure is elevated 155/90.  She is afebrile.  She is overall well-appearing and does not appear to be in significant amount of pain.  Says current pain is about 4-10.  She does have pain with deep palpation at the left thoracolumbar region, left CVA tenderness.  No abdominal tenderness and chest is clear to auscultation with regular heart rate and normal rhythm.  X-ray of left ribs and chest ordered and reviewed by me.  X-ray is normal.  UA obtained shows greater than 1.030 specific gravity, trace ketones and trace hemoglobin.  Reviewed all results with patient.  Symptoms most consistent with musculoskeletal pain.  We also discussed other possibilities including kidney stone.  She says that she has had kidney stones in the past but 9 years ago but since she had her parathyroid hormone removed she has not had any  issues with this and the pain does not feel similar.  Additionally we discussed possibility of PE since she has pleuritic pain and is on hormones.  Patient not ready to go to the emergency department this time to have further work-up for this.  She has elected to monitor herself closely and try prednisone as well as Flexeril.  Reviewed other supportive care techniques.  Again, reviewed when to return or go to the emergency department.  Final Clinical Impressions(s) / UC Diagnoses   Final diagnoses:  Acute right-sided back pain,  unspecified back location  Pleuritic pain     Discharge Instructions      X-ray looks good. Urine test shows that you are a little dehydrated so you need to increase fluids.  Back pain most consistent with musculoskeletal type pain at this time.  Since ibuprofen has not helped I have sent in prednisone and a muscle relaxer.  See more information below.  If you have worsening of your back pain, no improvement in the next couple of days, fever, cough, shortness of breath, anterior chest pain, blood in the urine or urinary symptoms or any worsening of her symptoms, please go to the emergency department.  We discussed other possibilities for your pain including a kidney stone or pulmonary embolism but I have low suspicion for these conditions.  BACK PAIN: Stressed avoiding painful activities . RICE (REST, ICE, COMPRESSION, ELEVATION) guidelines reviewed. May alternate ice and heat. Consider use of muscle rubs, Salonpas patches, etc. Use medications as directed including muscle relaxers if prescribed. Take anti-inflammatory medications as prescribed or OTC NSAIDs/Tylenol.  F/u with PCP in 7-10 days for reexamination, and please feel free to call or return to the urgent care at any time for any questions or concerns you may have and we will be happy to help you!   BACK PAIN RED FLAGS: If the back pain acutely worsens or there are any red flag symptoms such as numbness/tingling, leg weakness, saddle anesthesia, or loss of bowel/bladder control, go immediately to the ER. Follow up with [place] as scheduled or sooner if the pain does not begin to resolve or if it worsens before the follow up       ED Prescriptions     Medication Sig Dispense Auth. Provider   predniSONE (DELTASONE) 10 MG tablet Take 6 tabs PO day 1 and decrease by 1 tab daily until complete 21 tablet Laurene Footman B, PA-C   cyclobenzaprine (FLEXERIL) 10 MG tablet Take 1 tablet (10 mg total) by mouth 3 (three) times daily as needed for muscle  spasms. 20 tablet Gretta Cool      PDMP not reviewed this encounter.   Danton Clap, PA-C 09/24/20 1326

## 2020-09-24 NOTE — Telephone Encounter (Signed)
From: Maureen Aguilar  Sent: 09/21/2020   1:50 PM EDT  To: [facility]  Subject: Appointment scheduled from [place]               Appointment For: Maureen Aguilar ([id number])  Visit Type: OFFICE VISIT (1004)    09/26/2020    3:20 PM  20 mins.  Pleas Koch, NP    [facility]    Patient Comments:  I'm having pain along my ribs on the left side. It hurts to breath. It started Tuesday and hasn't gotten any better. I've been taking ibuprofen and putting heat on it.    Above is my chart note; I spoke with pt; pt said when she breathes there is pain in ribcage on lt side.  No CP but does not hurt to touch chest but if lays on lt side of chest. Pt having difficulty in sleeping due to pain on lt side of ribs.No known injury. Pt said she has had a H/A for 2 days consistently with pain level of 5. Ibuprofen not help the pain. Pt has had S/T for  1 yr. Pt has head congestion on and off for 1 yr.No cough and no SOB and no fever.No covid exposure and pt has done several home covid test that were neg. Last negative covid test was 09/21/20. Lt rib pain is 8 - 9 and at times if sitting or laying has to have someone to pull her up. Pt would like to be seen sooner than 09/26/20; [facility] has no available appts today and I scheduled pt an appt at [place] 09/24/20 at 11AM. UC & ED precautions given and pt voiced understanding. Pt will leave appt with Gentry Fitz NP on 09/26/20 until after UC appt today to be sure issues are resolved. If pt does not need to see Anda Kraft then she will call and cancel the 09/26/20 appt. Pt also wanted note to Gentry Fitz NP for referral due to external hemorrhoids for years.No bleeding. Pt said is more annoying and embarrassing and would like them gone. Pt request cb after reviewed by Anda Kraft about referral. Sending note to Gentry Fitz NP and [facility] CMA.

## 2020-09-24 NOTE — Discharge Instructions (Addendum)
X-ray looks good. Urine test shows that you are a little dehydrated so you need to increase fluids.  Back pain most consistent with musculoskeletal type pain at this time.  Since ibuprofen has not helped I have sent in prednisone and a muscle relaxer.  See more information below.  If you have worsening of your back pain, no improvement in the next couple of days, fever, cough, shortness of breath, anterior chest pain, blood in the urine or urinary symptoms or any worsening of her symptoms, please go to the emergency department.  We discussed other possibilities for your pain including a kidney stone or pulmonary embolism but I have low suspicion for these conditions.  BACK PAIN: Stressed avoiding painful activities . RICE (REST, ICE, COMPRESSION, ELEVATION) guidelines reviewed. May alternate ice and heat. Consider use of muscle rubs, Salonpas patches, etc. Use medications as directed including muscle relaxers if prescribed. Take anti-inflammatory medications as prescribed or OTC NSAIDs/Tylenol.  F/u with PCP in 7-10 days for reexamination, and please feel free to call or return to the urgent care at any time for any questions or concerns you may have and we will be happy to help you!   BACK PAIN RED FLAGS: If the back pain acutely worsens or there are any red flag symptoms such as numbness/tingling, leg weakness, saddle anesthesia, or loss of bowel/bladder control, go immediately to the ER. Follow up with [place] as scheduled or sooner if the pain does not begin to resolve or if it worsens before the follow up

## 2020-09-24 NOTE — Telephone Encounter (Signed)
Noted, agree with evaluation 

## 2020-09-24 NOTE — ED Triage Notes (Signed)
Pt c/o left sided back pain in the rib area. Started about a week ago. No known injury. She states the pain is worse when she lays on her left side. She states her pain is a burning pain. Has not noticed a rash.

## 2020-09-25 NOTE — Addendum Note (Signed)
Addended by: Pleas Koch on: 09/25/2020 09:06 AM   Modules accepted: Orders

## 2020-09-25 NOTE — Telephone Encounter (Signed)
Please notify patient: Referral placed to GI for [place]. She should receive a call within 2 weeks for an appointment.

## 2020-09-25 NOTE — Telephone Encounter (Signed)
Left message to return call to our office.  

## 2020-09-26 ENCOUNTER — Ambulatory Visit: Payer: Managed Care, Other (non HMO) | Admitting: Primary Care

## 2020-09-26 NOTE — Telephone Encounter (Signed)
Called patient received call from GI yesterday. No further action needed at this time.

## 2020-10-12 ENCOUNTER — Other Ambulatory Visit: Payer: Self-pay | Admitting: Primary Care

## 2020-10-12 DIAGNOSIS — G4700 Insomnia, unspecified: Secondary | ICD-10-CM

## 2020-10-26 ENCOUNTER — Ambulatory Visit: Payer: Managed Care, Other (non HMO) | Admitting: Gastroenterology

## 2020-10-26 ENCOUNTER — Encounter: Payer: Self-pay | Admitting: Gastroenterology

## 2020-10-26 ENCOUNTER — Other Ambulatory Visit: Payer: Self-pay

## 2020-10-26 VITALS — BP 141/83 | HR 87 | Temp 98.0°F | Ht 66.0 in | Wt 209.0 lb

## 2020-10-26 DIAGNOSIS — K640 First degree hemorrhoids: Secondary | ICD-10-CM | POA: Diagnosis not present

## 2020-10-26 DIAGNOSIS — K219 Gastro-esophageal reflux disease without esophagitis: Secondary | ICD-10-CM | POA: Diagnosis not present

## 2020-10-26 DIAGNOSIS — K5904 Chronic idiopathic constipation: Secondary | ICD-10-CM

## 2020-10-26 DIAGNOSIS — K644 Residual hemorrhoidal skin tags: Secondary | ICD-10-CM

## 2020-10-26 NOTE — Progress Notes (Signed)
Cephas Darby, MD [address]  [place]  [place],  [address]  Main: [phone]  Fax: [phone]    Gastroenterology Consultation  Referring Provider:     Pleas Koch, NP Primary Care Physician:  Pleas Koch, NP Primary Gastroenterologist:  Dr. Cephas Darby Reason for Consultation:     Symptomatic hemorrhoids, chronic GERD, chronic constipation        HPI:   Maureen Aguilar is a 45 y.o. female referred by Pleas Koch, NP  for consultation & management of hemorrhoidal symptoms.  Patient reports that she has been experiencing several years history of itching, burning, discomfort in the rectum.  She does report irregular bowel habits, has bowel movement generally every 3 days associated with straining.  She takes over-the-counter stool softener and Metamucil, fiber Gummies.  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  She thinks she needs to drink more water.  Patient denies any rectal bleeding.  Patient tried several over-the-counter hemorrhoidal remedies with temporary relief only.  Patient does have history of chronic heartburn for which she takes over-the-counter Prilosec as needed  Her labs are unremarkable Patient does not smoke or drink alcohol History of endometriosis Walks 3 miles a day in the morning Does report severe insomnia for several years, has tried melatonin, trazodone  NSAIDs: None  Antiplts/Anticoagulants/Anti thrombotics: None  GI Procedures: None Her insurance did not approve for screening colonoscopy at age 71  Past Medical History:  Diagnosis Date   Depression    Endometriosis    Frequent headaches    GERD (gastroesophageal reflux disease)    History of kidney stones    History of UTI    Parathyroid abnormality ([place])     Past Surgical History:  Procedure Laterality Date   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    Current Outpatient Medications:    citalopram (CELEXA) 10 MG tablet, TAKE 1 TABLET(10 MG) BY MOUTH DAILY, Disp: 90 tablet, Rfl: 1   cyclobenzaprine (FLEXERIL) 10 MG tablet, Take 1 tablet (10 mg total) by mouth 3 (three) times daily as needed for muscle spasms., Disp: 20 tablet, Rfl: 0   levonorgestrel (MIRENA) 20 MCG/DAY IUD, 1 each by Intrauterine route once., Disp: , Rfl:    omeprazole (PRILOSEC) 20 MG capsule, Take 40 mg by mouth daily., Disp: , Rfl:    predniSONE (DELTASONE) 10 MG tablet, Take 6 tabs PO day 1 and decrease by 1 tab daily until complete, Disp: 21 tablet, Rfl: 0   traZODone (DESYREL) 50 MG tablet, TAKE 1 TO 2 TABLETS BY MOUTH AT BEDTIME AS NEEDED FOR SLEEP. Office visit due in late December., Disp: 180 tablet, Rfl: 0  Family History  Problem Relation Age of Onset   Alcohol abuse Mother    Arthritis Mother    Mental illness Mother    Mental illness Brother        bipolar   Cancer Maternal Aunt        breast   Breast cancer Maternal Aunt 29   Mental illness Maternal Grandmother    Asthma Maternal Grandmother    Hyperlipidemia Maternal Grandmother    Hypertension Maternal Grandmother    Heart disease Maternal Grandfather      Social History   Tobacco  Use   Smoking status: Never   Smokeless tobacco: Never  Vaping Use   Vaping Use: Never used  Substance Use Topics   Alcohol use: No    Alcohol/week: 0.0 standard drinks   Drug use: Not Currently    Allergies as of 10/26/2020   (No Known Allergies)    Review of Systems:    All systems reviewed and negative except where noted in HPI.   Physical Exam:  BP (!) 141/83 (BP Location: Right Arm, Patient Position: Sitting, Cuff Size: Large)   Pulse 87   Temp 98 F (36.7 C) (Oral)   Ht 5\' 6"  (1.676 m)   Wt 209 lb (94.8 kg)   BMI 33.73 kg/m  No LMP recorded. (Menstrual status: IUD).  General:   Alert,  Well-developed,  well-nourished, pleasant and cooperative in NAD Head:  Normocephalic and atraumatic. Eyes:  Sclera clear, no icterus.   Conjunctiva pink. Ears:  Normal auditory acuity. Nose:  No deformity, discharge, or lesions. Mouth:  No deformity or lesions,oropharynx pink & moist. Neck:  Supple; no masses or thyromegaly. Lungs:  Respirations even and unlabored.  Clear throughout to auscultation.   No wheezes, crackles, or rhonchi. No acute distress. Heart:  Regular rate and rhythm; no murmurs, clicks, rubs, or gallops. Abdomen:  Normal bowel sounds. Soft, non-tender and non-distended without masses, hepatosplenomegaly or hernias noted.  No guarding or rebound tenderness.   Rectal: Multiple perianal skin tags, do not appear inflamed, nontender digital rectal exam Msk:  Symmetrical without gross deformities. Good, equal movement & strength bilaterally. Pulses:  Normal pulses noted. Extremities:  No clubbing or edema.  No cyanosis. Neurologic:  Alert and oriented x3;  grossly normal neurologically. Skin:  Intact without significant lesions or rashes. No jaundice. Psych:  Alert and cooperative. Normal mood and affect.  Imaging Studies: No abdominal imaging  Assessment and Plan:   Maureen Aguilar is a 45 y.o. pleasant Caucasian female with history of depression, chronic insomnia is seen in consultation for grade 1 symptomatic hemorrhoids, chronic idiopathic constipation and chronic GERD  Grade 1 external hemorrhoids Discussed about hemorrhoid ligation, the procedure, risks and benefits Consent obtained Perform hemorrhoid ligation today  Chronic idiopathic constipation Discussed about high-fiber diet, information provided Adequate intake of water Start MiraLAX 17 g 1-2 times daily along with fiber supplements such as Benefiber, Citrucel or Metamucil daily  Perianal skin tags Will refer to general surgery for excision of skin tags after hemorrhoid ligation if symptoms are persistent  Chronic  GERD Discussed about antireflux lifestyle Continue omeprazole 20 mg daily  Colon cancer screening Advised patient to call her insurance company again regarding screening colonoscopy   Follow up in 2 weeks   Cephas Darby, MD

## 2020-10-26 NOTE — Patient Instructions (Signed)
High-Fiber Eating Plan °Fiber, also called dietary fiber, is a type of carbohydrate. It is found foods such as fruits, vegetables, whole grains, and beans. A high-fiber diet can have many health benefits. Your health care provider may recommend a high-fiber diet to help: °Prevent constipation. Fiber can make your bowel movements more regular. °Lower your cholesterol. °Relieve the following conditions: °Inflammation of veins in the anus (hemorrhoids). °Inflammation of specific areas of the digestive tract (uncomplicated diverticulosis). °A problem of the large intestine, also called the colon, that sometimes causes pain and diarrhea (irritable bowel syndrome, or IBS). °Prevent overeating as part of a weight-loss plan. °Prevent heart disease, type 2 diabetes, and certain cancers. °What are tips for following this plan? °Reading food labels ° °Check the nutrition facts label on food products for the amount of dietary fiber. Choose foods that have 5 grams of fiber or more per serving. °The goals for recommended daily fiber intake include: °Men (age 50 or younger): 34-38 g. °Men (over age 50): 28-34 g. °Women (age 50 or younger): 25-28 g. °Women (over age 50): 22-25 g. °Your daily fiber goal is _____________ g. °Shopping °Choose whole fruits and vegetables instead of processed forms, such as apple juice or applesauce. °Choose a wide variety of high-fiber foods such as avocados, lentils, oats, and kidney beans. °Read the nutrition facts label of the foods you choose. Be aware of foods with added fiber. These foods often have high sugar and sodium amounts per serving. °Cooking °Use whole-grain flour for baking and cooking. °Cook with brown rice instead of white rice. °Meal planning °Start the day with a breakfast that is high in fiber, such as a cereal that contains 5 g of fiber or more per serving. °Eat breads and cereals that are made with whole-grain flour instead of refined flour or white flour. °Eat brown rice, bulgur  wheat, or millet instead of white rice. °Use beans in place of meat in soups, salads, and pasta dishes. °Be sure that half of the grains you eat each day are whole grains. °General information °You can get the recommended daily intake of dietary fiber by: °Eating a variety of fruits, vegetables, grains, nuts, and beans. °Taking a fiber supplement if you are not able to take in enough fiber in your diet. It is better to get fiber through food than from a supplement. °Gradually increase how much fiber you consume. If you increase your intake of dietary fiber too quickly, you may have bloating, cramping, or gas. °Drink plenty of water to help you digest fiber. °Choose high-fiber snacks, such as berries, raw vegetables, nuts, and popcorn. °What foods should I eat? °Fruits °Berries. Pears. Apples. Oranges. Avocado. Prunes and raisins. Dried figs. °Vegetables °Sweet potatoes. Spinach. Kale. Artichokes. Cabbage. Broccoli. Cauliflower. Green peas. Carrots. Squash. °Grains °Whole-grain breads. Multigrain cereal. Oats and oatmeal. Brown rice. Barley. Bulgur wheat. Millet. Quinoa. Bran muffins. Popcorn. Rye wafer crackers. °Meats and other proteins °Navy beans, kidney beans, and pinto beans. Soybeans. Split peas. Lentils. Nuts and seeds. °Dairy °Fiber-fortified yogurt. °Beverages °Fiber-fortified soy milk. Fiber-fortified orange juice. °Other foods °Fiber bars. °The items listed above may not be a complete list of recommended foods and beverages. Contact a dietitian for more information. °What foods should I avoid? °Fruits °Fruit juice. Cooked, strained fruit. °Vegetables °Fried potatoes. Canned vegetables. Well-cooked vegetables. °Grains °White bread. Pasta made with refined flour. White rice. °Meats and other proteins °Fatty cuts of meat. Fried chicken or fried fish. °Dairy °Milk. Yogurt. Cream cheese. Sour cream. °Fats and   oils °Butters. °Beverages °Soft drinks. °Other foods °Cakes and pastries. °The items listed above may  not be a complete list of foods and beverages to avoid. Talk with your dietitian about what choices are best for you. °Summary °Fiber is a type of carbohydrate. It is found in foods such as fruits, vegetables, whole grains, and beans. °A high-fiber diet has many benefits. It can help to prevent constipation, lower blood cholesterol, aid weight loss, and reduce your risk of heart disease, diabetes, and certain cancers. °Increase your intake of fiber gradually. Increasing fiber too quickly may cause cramping, bloating, and gas. Drink plenty of water while you increase the amount of fiber you consume. °The best sources of fiber include whole fruits and vegetables, whole grains, nuts, seeds, and beans. °This information is not intended to replace advice given to you by your health care provider. Make sure you discuss any questions you have with your health care provider. °Document Revised: 05/05/2019 Document Reviewed: 05/05/2019 °Elsevier Patient Education © 2022 [employer] ° °

## 2020-10-26 NOTE — Progress Notes (Signed)

## 2020-11-05 ENCOUNTER — Other Ambulatory Visit: Payer: Self-pay

## 2020-11-09 ENCOUNTER — Other Ambulatory Visit: Payer: Self-pay

## 2020-11-09 ENCOUNTER — Ambulatory Visit: Payer: Managed Care, Other (non HMO) | Admitting: Gastroenterology

## 2020-11-09 ENCOUNTER — Encounter: Payer: Self-pay | Admitting: Gastroenterology

## 2020-11-09 VITALS — BP 143/94 | HR 106 | Temp 98.0°F | Ht 66.0 in | Wt 207.0 lb

## 2020-11-09 DIAGNOSIS — K644 Residual hemorrhoidal skin tags: Secondary | ICD-10-CM

## 2020-11-09 DIAGNOSIS — K5904 Chronic idiopathic constipation: Secondary | ICD-10-CM | POA: Diagnosis not present

## 2020-11-09 NOTE — Progress Notes (Signed)
Cephas Darby, MD [address]  [place]  [place], [place] [address]  Main: [phone]  Fax: [phone]    Gastroenterology Consultation  Referring Provider:     Pleas Koch, NP Primary Care Physician:  Pleas Koch, NP Primary Gastroenterologist:  Dr. Cephas Darby Reason for Consultation:     Symptomatic hemorrhoids, chronic GERD, chronic constipation        HPI:   Maureen Aguilar is a 45 y.o. female referred by Pleas Koch, NP  for consultation & management of hemorrhoidal symptoms.  Patient reports that she has been experiencing several years history of itching, burning, discomfort in the rectum.  She does report irregular bowel habits, has bowel movement generally every 3 days associated with straining.  She takes over-the-counter stool softener and Metamucil, fiber Gummies.  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  She thinks she needs to drink more water.  Patient denies any rectal bleeding.  Patient tried several over-the-counter hemorrhoidal remedies with temporary relief only.  Patient does have history of chronic heartburn for which she takes over-the-counter Prilosec as needed  Follow-up visit 11/09/2020 Patient is here to discuss about removal of perianal skin tags.  She reports the first banding went well.  She is no longer suffering from constipation, has been taking MiraLAX daily.  She is particularly interested in referral to general surgery for excision of skin tags as this is interfering with her sexual life and she feels very embarrassing being commented on it.  She would like to defer hemorrhoid ligation today.  She thinks she is not bothered by hemorrhoid symptoms anymore.  Her labs are unremarkable Patient does not smoke or drink alcohol History of endometriosis Walks 3 miles a day in the morning Does report severe insomnia for several years, has tried melatonin,  trazodone  NSAIDs: None  Antiplts/Anticoagulants/Anti thrombotics: None  GI Procedures: None Her insurance did not approve for screening colonoscopy at age 69  Past Medical History:  Diagnosis Date   Depression    Endometriosis    Frequent headaches    GERD (gastroesophageal reflux disease)    History of kidney stones    History of UTI    Parathyroid abnormality ([place])     Past Surgical History:  Procedure Laterality Date   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    Current Outpatient Medications:    citalopram (CELEXA) 10 MG tablet, TAKE 1 TABLET(10 MG) BY MOUTH DAILY, Disp: 90 tablet, Rfl: 1   levonorgestrel (MIRENA) 20 MCG/DAY IUD, 1 each by Intrauterine route once., Disp: , Rfl:    omeprazole (PRILOSEC) 20 MG capsule, Take 40 mg by mouth daily., Disp: , Rfl:    traZODone (DESYREL) 50 MG tablet, TAKE 1 TO 2 TABLETS BY MOUTH AT BEDTIME AS NEEDED FOR SLEEP. Office visit due in late December., Disp: 180 tablet, Rfl: 0   predniSONE (DELTASONE) 10 MG tablet, Take 6 tabs PO day 1 and decrease by 1 tab daily until complete (Patient not taking: Reported on 11/09/2020), Disp: 21 tablet, Rfl: 0  Family History  Problem Relation Age of Onset   Alcohol abuse Mother    Arthritis Mother    Mental illness Mother    Mental illness Brother  bipolar   Cancer Maternal Aunt        breast   Breast cancer Maternal Aunt 29   Mental illness Maternal Grandmother    Asthma Maternal Grandmother    Hyperlipidemia Maternal Grandmother    Hypertension Maternal Grandmother    Heart disease Maternal Grandfather      Social History   Tobacco Use   Smoking status: Never   Smokeless tobacco: Never  Vaping Use   Vaping Use: Never used  Substance Use Topics   Alcohol use: No    Alcohol/week: 0.0 standard drinks   Drug use: Not Currently    Allergies as of 11/09/2020   (No  Known Allergies)    Review of Systems:    All systems reviewed and negative except where noted in HPI.   Physical Exam:  BP (!) 143/94 (BP Location: Right Arm, Patient Position: Sitting, Cuff Size: Large)   Pulse (!) 106   Temp 98 F (36.7 C) (Temporal)   Ht 5\' 6"  (1.676 m)   Wt 207 lb (93.9 kg)   BMI 33.41 kg/m  No LMP recorded. (Menstrual status: IUD).  General:   Alert,  Well-developed, well-nourished, pleasant and cooperative in NAD Head:  Normocephalic and atraumatic. Eyes:  Sclera clear, no icterus.   Conjunctiva pink. Ears:  Normal auditory acuity. Nose:  No deformity, discharge, or lesions. Mouth:  No deformity or lesions,oropharynx pink & moist. Neck:  Supple; no masses or thyromegaly. Lungs:  Respirations even and unlabored.  Clear throughout to auscultation.   No wheezes, crackles, or rhonchi. No acute distress. Heart:  Regular rate and rhythm; no murmurs, clicks, rubs, or gallops. Abdomen:  Normal bowel sounds. Soft, non-tender and non-distended without masses, hepatosplenomegaly or hernias noted.  No guarding or rebound tenderness.   Rectal: Multiple perianal skin tags, do not appear inflamed, nontender digital rectal exam Msk:  Symmetrical without gross deformities. Good, equal movement & strength bilaterally. Pulses:  Normal pulses noted. Extremities:  No clubbing or edema.  No cyanosis. Neurologic:  Alert and oriented x3;  grossly normal neurologically. Skin:  Intact without significant lesions or rashes. No jaundice. Psych:  Alert and cooperative. Normal mood and affect.  Imaging Studies: No abdominal imaging  Assessment and Plan:   Maureen Aguilar is a 45 y.o. pleasant Caucasian female with history of depression, chronic insomnia is seen in consultation for grade 1 symptomatic hemorrhoids, chronic idiopathic constipation and chronic GERD  Grade 1 external hemorrhoids S/p ligation of right posterior hemorrhoid Defer hemorrhoid ligation today per patient's  choice  Chronic idiopathic constipation: Currently resolved Continue high-fiber diet, information provided Adequate intake of water Continue MiraLAX 17 g 1-2 times daily along with fiber supplements such as Benefiber, Citrucel or Metamucil daily  Perianal skin tags Will refer to general surgery for excision of skin tags only  Chronic GERD Discussed about antireflux lifestyle Continue omeprazole 20 mg daily  Colon cancer screening Advised patient to call her insurance company again regarding screening colonoscopy   Follow up as needed   Cephas Darby, MD

## 2020-11-22 ENCOUNTER — Ambulatory Visit: Payer: Managed Care, Other (non HMO) | Admitting: Surgery

## 2020-11-22 ENCOUNTER — Encounter: Payer: Self-pay | Admitting: Surgery

## 2020-11-22 ENCOUNTER — Other Ambulatory Visit: Payer: Self-pay

## 2020-11-22 VITALS — BP 137/95 | HR 109 | Temp 98.9°F | Ht 66.0 in | Wt 207.0 lb

## 2020-11-22 DIAGNOSIS — K644 Residual hemorrhoidal skin tags: Secondary | ICD-10-CM

## 2020-11-22 NOTE — Progress Notes (Signed)
Patient ID: Maureen Aguilar, female   DOB: 1975/09/21, 45 y.o.   MRN: [id number]  Chief Complaint: Stinging discomfort with anal hygiene  History of Present Illness Maureen Aguilar is a 45 y.o. female with concerns regarding external hemorrhoidal tags.  She reports bowel movements are now soft while taking MiraLAX.  She has not added fiber supplementation.  She reports that her primary issue is with post defecation hygiene, she reports that lover had mocked her for her anal skin tags.  She denies bleeding, denies defecation pain.  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    No Known Allergies  Current Outpatient Medications  Medication Sig Dispense Refill   citalopram (CELEXA) 10 MG tablet TAKE 1 TABLET(10 MG) BY MOUTH DAILY 90 tablet 1   levonorgestrel (MIRENA) 20 MCG/DAY IUD 1 each by Intrauterine route once.     omeprazole (PRILOSEC) 20 MG capsule Take 40 mg by mouth daily.     traZODone (DESYREL) 50 MG tablet TAKE 1 TO 2 TABLETS BY MOUTH AT BEDTIME AS NEEDED FOR SLEEP. Office visit due in late December. 180 tablet 0   No current facility-administered medications for this visit.    Family History Family History  Problem Relation Age of Onset   Alcohol abuse Mother    Arthritis Mother    Mental illness Mother    Mental illness Brother        bipolar   Cancer Maternal Aunt        breast   Breast cancer Maternal Aunt 29   Mental illness Maternal Grandmother    Asthma Maternal Grandmother    Hyperlipidemia Maternal Grandmother    Hypertension Maternal Grandmother    Heart  disease Maternal Grandfather       Social History Social History   Tobacco Use   Smoking status: Never   Smokeless tobacco: Never  Vaping Use   Vaping Use: Never used  Substance Use Topics   Alcohol use: No    Alcohol/week: 0.0 standard drinks   Drug use: Not Currently        Review of Systems  Constitutional:  Positive for malaise/fatigue.  HENT:  Positive for sore throat and tinnitus.   Eyes:  Positive for blurred vision.  Respiratory: Negative.    Cardiovascular: Negative.   Gastrointestinal:  Positive for constipation, diarrhea and heartburn.  Genitourinary: Negative.   Skin: Negative.   Neurological:  Positive for dizziness.  Psychiatric/Behavioral:  Positive for depression.      Physical Exam Blood pressure (!) 137/95, pulse (!) 109, temperature 98.9 F (37.2 C), height 5\' 6"  (1.676 m), weight 207 lb (93.9 kg), last menstrual period 11/14/2020, SpO2 98 %. Last Weight  Most recent update: 11/22/2020  1:53 PM    Weight  93.9 kg (207 lb)             CONSTITUTIONAL: Well developed, and nourished, appropriately responsive and aware without distress.   EYES: Sclera non-icteric.   EARS, NOSE, MOUTH AND THROAT: Mask  worn.  Hearing is intact to voice.  NECK: Trachea is midline, and there is no jugular venous distension.  LYMPH NODES:  Lymph nodes in the neck are not enlarged. RESPIRATORY:  Lungs are clear, and breath sounds are equal bilaterally. Normal respiratory effort without pathologic use of accessory muscles. CARDIOVASCULAR: Heart is regular in rate and rhythm. GI: The abdomen is  soft, nontender, and nondistended. There were no palpable masses. I did not appreciate hepatosplenomegaly. There were normal bowel sounds. GU: Chrys Racer is present as chaperone.  On digital rectal exam identify 4 quadrants of soft minimally enlarged anal hemorrhoidal skin tags, nonindurated, no evidence of thrombosis, no remarkable internal hemorrhoids to speak of.  No perianal  induration or tenderness.  Actually quite small in perspective.  No evidence of diminished hygiene present. MUSCULOSKELETAL:  Symmetrical muscle tone appreciated in all four extremities.    SKIN: Skin turgor is normal. No pathologic skin lesions appreciated.  NEUROLOGIC:  Motor and sensation appear grossly normal.  Cranial nerves are grossly without defect. PSYCH:  Alert and oriented to person, place and time. Affect is appropriate for situation.  Data Reviewed I have personally reviewed what is currently available of the patient's imaging, recent labs and medical records.   Labs:  CBC Latest Ref Rng & Units 12/12/2019 11/16/2018 05/05/2017  WBC 3.4 - 10.8 x10E3/uL 12.9(H) 8.5 9.4  Hemoglobin 11.1 - 15.9 g/dL 12.3 13.4 12.8  Hematocrit 34.0 - 46.6 % 37.0 39.7 37.8  Platelets 150 - 450 x10E3/uL 302 271 370   CMP Latest Ref Rng & Units 01/02/2020 12/12/2019 11/16/2018  Glucose 65 - 99 mg/dL 96 121(H) 89  BUN 6 - 24 mg/dL 16 14 13   Creatinine 0.57 - 1.00 mg/dL 0.85 1.04(H) 0.95  Sodium 134 - 144 mmol/L 141 141 139  Potassium 3.5 - 5.2 mmol/L 4.4 4.6 4.8  Chloride 96 - 106 mmol/L 106 103 104  CO2 20 - 29 mmol/L 23 24 22   Calcium 8.7 - 10.2 mg/dL 9.4 9.9 9.6  Total Protein 6.0 - 8.5 g/dL 6.8 - 7.2  Total Bilirubin 0.0 - 1.2 mg/dL 0.4 - 0.4  Alkaline Phos 44 - 121 IU/L 93 - 74  AST 0 - 40 IU/L 20 - 14  ALT 0 - 32 IU/L 14 - 9      Imaging:  Within last 24 hrs: No results found.  Assessment    External hemorrhoidal skin tags. Patient Active Problem List   Diagnosis Date Noted   Environmental allergies 11/16/2018   History of parathyroidectomy ([facility]) 11/16/2018   Insomnia 06/04/2017   Rash and nonspecific skin eruption 04/28/2017   Elevated blood pressure reading 10/30/2016   Chronic midline low back pain without sciatica 10/15/2015   Depression 06/26/2014   GERD (gastroesophageal reflux disease) 06/26/2014   Chronic headaches 06/26/2014    Plan    I advised continued  utilization of laxatives and the supplementation of fiber to ensure that these tags do not progress in any way. Advised to pursue a goal of 25 to 30 g of fiber daily.  Made aware that the majority of this may be through natural sources, but advised to be aware of actual consumption and to ensure minimal consumption by daily supplementation.  Various forms of supplements discussed.  Strongly advised to consume more fluids to ensure adequate hydration, instructed to watch color of urine to determine adequacy of hydration.  Clarity is pursued in urine output, and bowel activity that correlates to significant meal intake.  Patient is to  avoid deferring having bowel movements, advised to take the time at the first sign of sensation, typically following meals and in the morning.  Subsequent utilization of MiraLAX to ensure at least daily movement, ideally twice daily bowel movements.  If multiple doses of MiraLAX are necessary utilize them.  We also discussed alternative means of hygiene including hand-held shower cleansing and toilet seat bidet modifications. We discovered the underlying reason for her presentation and encouraged her that she does not stand alone with this minor imperfection.  We strongly advised against any type of excision as this would require or create a circumferential scarring affect, and do not find that these tags to be of sufficient obstacle in regard to hygiene.  Face-to-face time spent with the patient and accompanying care providers(if present) was 30 minutes, with more than 50% of the time spent counseling, educating, and coordinating care of the patient.    These notes generated with voice recognition software. I apologize for typographical errors.  Ronny Bacon M.D., FACS 11/22/2020, 7:42 PM

## 2020-11-22 NOTE — Patient Instructions (Signed)
You may try using a hand held shower to help cleanse yourself. You may also consider getting a Bidet to help with cleaning the area.  If these areas get bothersome in the future call and let us know.   Follow-up with our office as needed.  Please call and ask to speak with a nurse if you develop questions or concerns.

## 2020-12-05 ENCOUNTER — Telehealth: Payer: Managed Care, Other (non HMO) | Admitting: Family

## 2020-12-05 DIAGNOSIS — R399 Unspecified symptoms and signs involving the genitourinary system: Secondary | ICD-10-CM

## 2020-12-05 MED ORDER — CEPHALEXIN 500 MG PO CAPS
500.0000 mg | ORAL_CAPSULE | Freq: Two times a day (BID) | ORAL | 0 refills | Status: DC
Start: 2020-12-05 — End: 2022-07-29

## 2020-12-05 NOTE — Progress Notes (Signed)

## 2021-01-08 ENCOUNTER — Other Ambulatory Visit: Payer: Self-pay | Admitting: Primary Care

## 2021-01-08 DIAGNOSIS — G4700 Insomnia, unspecified: Secondary | ICD-10-CM

## 2021-03-26 IMAGING — MG DIGITAL SCREENING BILAT W/ TOMO W/ CAD
8 series · 8 of 24 positions shown · non-contrast
Comparison: Previous exam(s).

CLINICAL DATA: Screening.

EXAM:
DIGITAL SCREENING BILATERAL MAMMOGRAM WITH TOMO AND CAD

[L CC synth-2D]
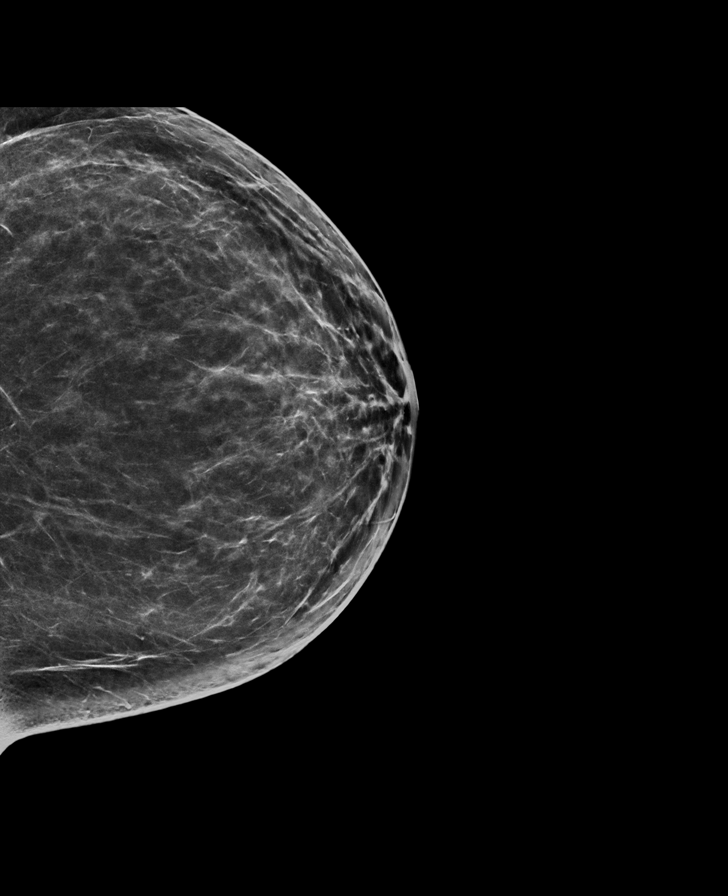

[L MLO synth-2D]
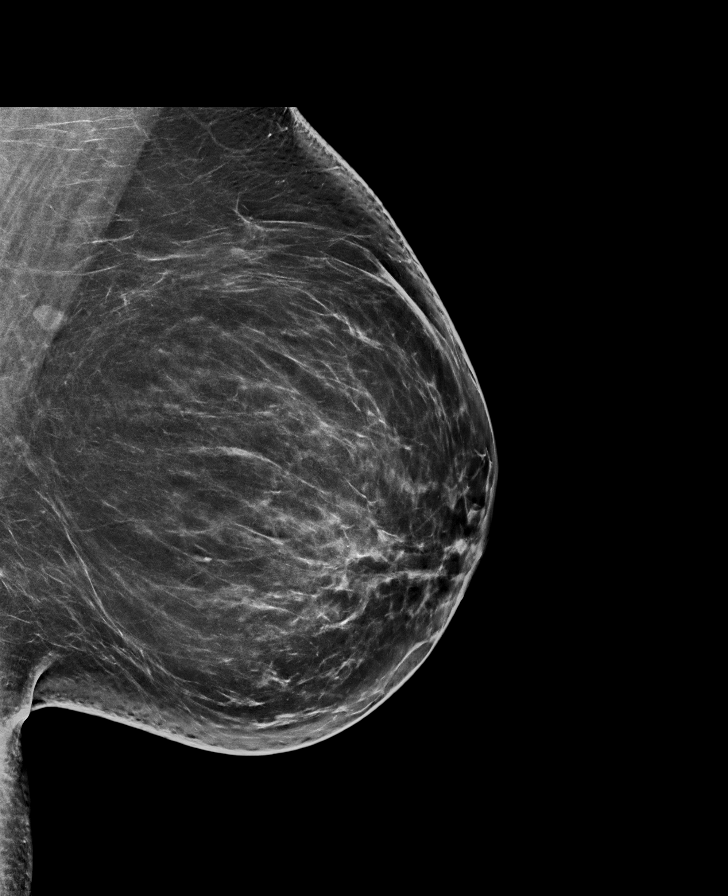

[R CC synth-2D]
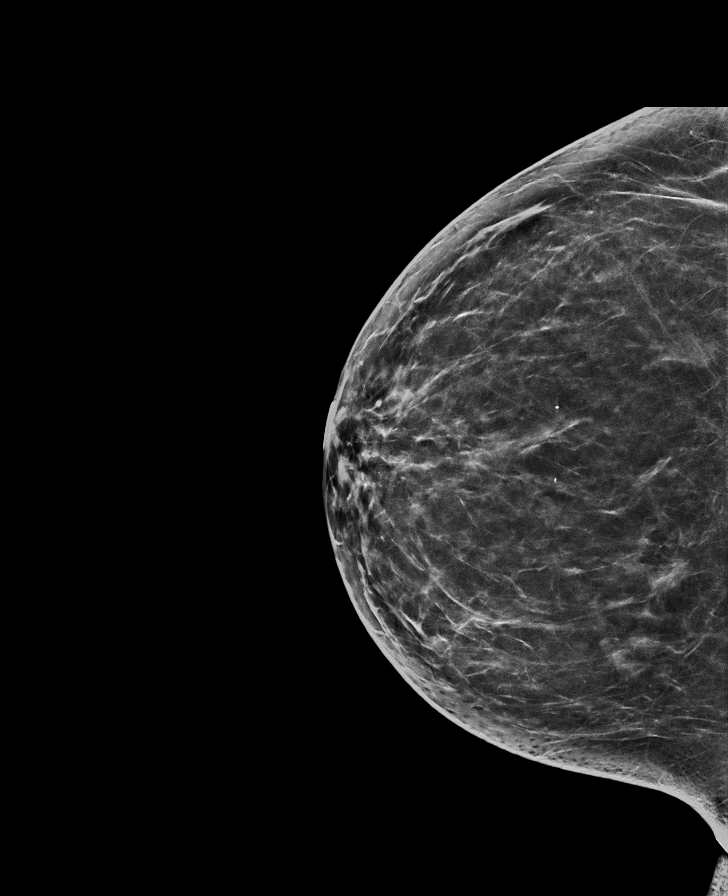

[R MLO synth-2D]
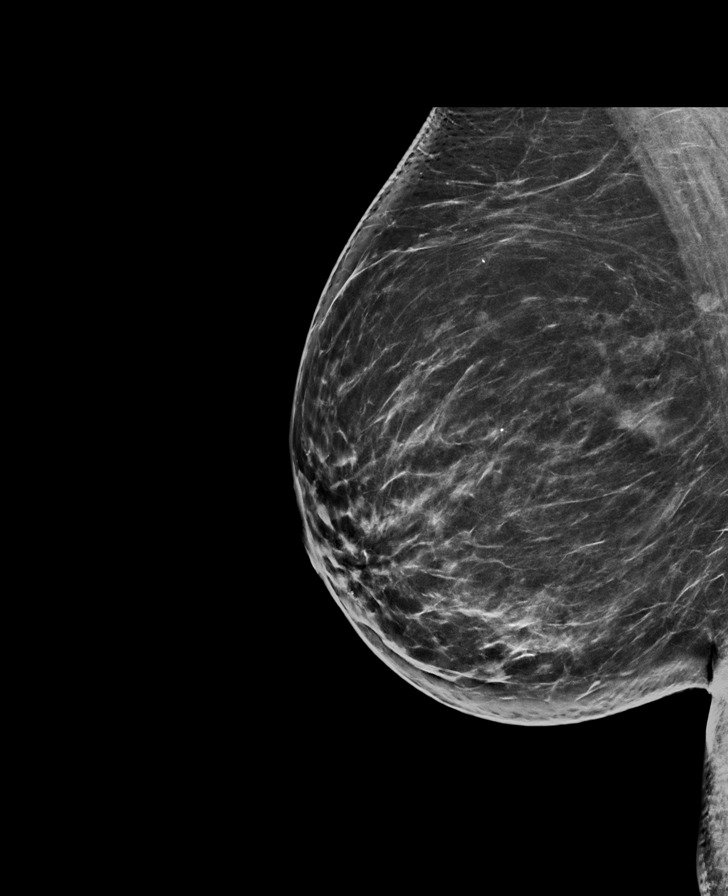

[R CC tomo · tomo slice 40/79.0]
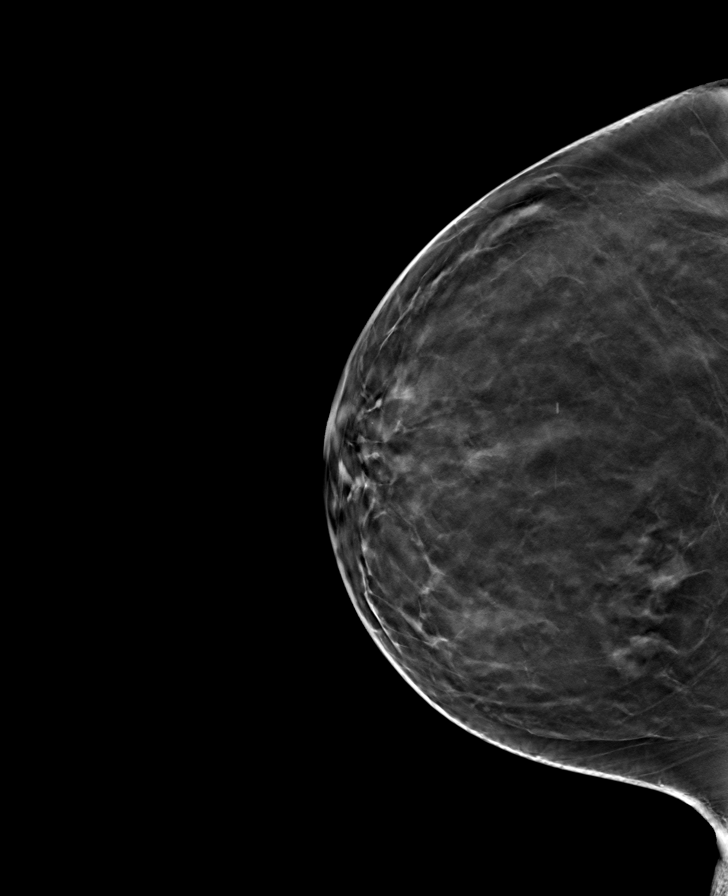

[L MLO tomo · tomo slice 45/89.0]
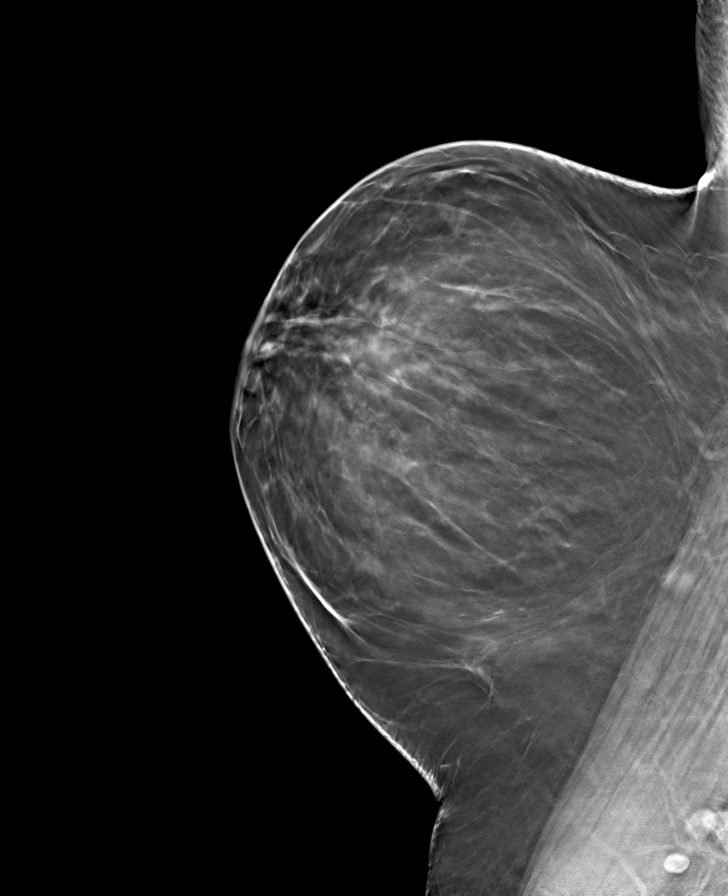

[L CC tomo · tomo slice 42/83.0]
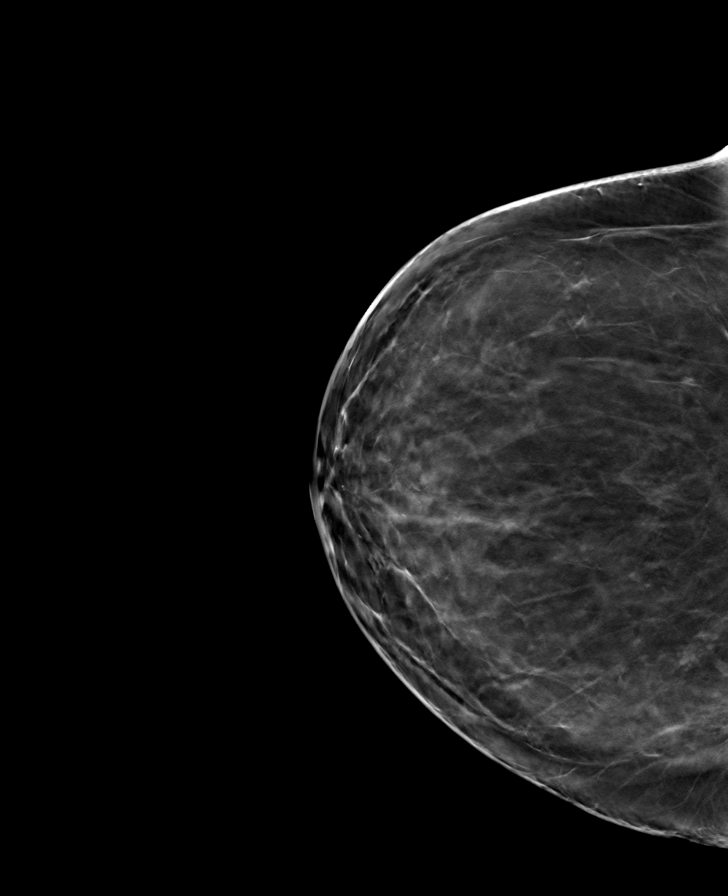

[R MLO tomo · tomo slice 42/83.0]
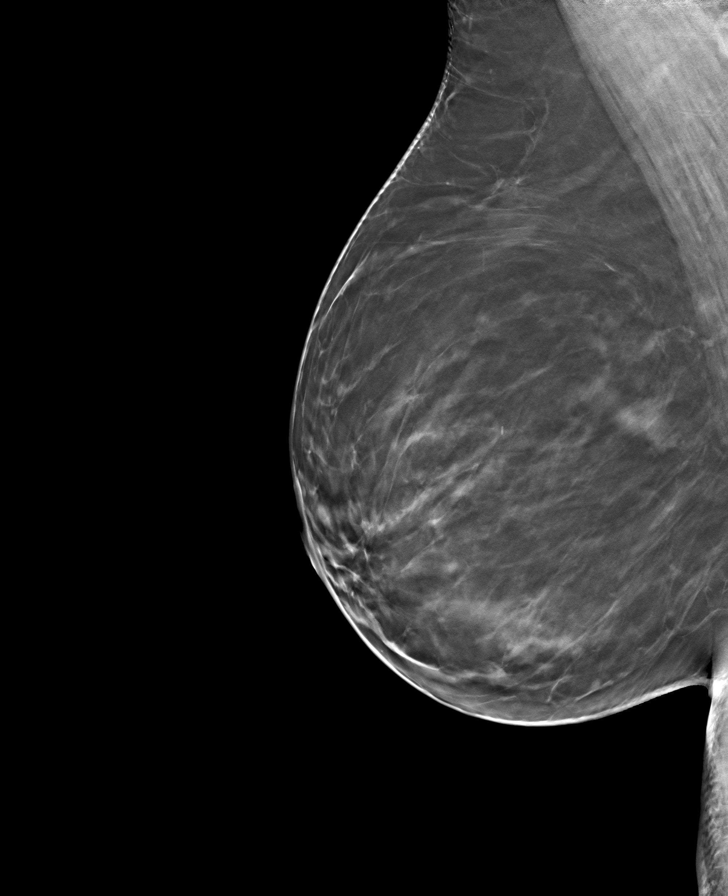

[8 of 24 positions shown; findings below may reference images not displayed]

ACR Breast Density Category b: There are scattered areas of
fibroglandular density.
FINDINGS: In the right breast, 2 asymmetries warrant further evaluation. In
the left breast, no findings suspicious for malignancy. Images were
processed with CAD.
IMPRESSION: Further evaluation is suggested for possible asymmetries in the
right breast.

RECOMMENDATION:
Diagnostic mammogram and possibly ultrasound of the right breast.
(Code:SI-9-FFL)

The patient will be contacted regarding the findings, and additional
imaging will be scheduled.

BI-RADS CATEGORY  0: Incomplete. Need additional imaging evaluation
and/or prior mammograms for comparison.

## 2022-03-28 ENCOUNTER — Ambulatory Visit: Payer: Managed Care, Other (non HMO) | Admitting: Nurse Practitioner

## 2022-04-03 ENCOUNTER — Encounter: Payer: Self-pay | Admitting: Nurse Practitioner

## 2022-04-03 ENCOUNTER — Ambulatory Visit: Payer: Managed Care, Other (non HMO) | Admitting: Nurse Practitioner

## 2022-04-03 VITALS — BP 108/62 | HR 83 | Ht 65.5 in | Wt 198.0 lb

## 2022-04-03 DIAGNOSIS — H9190 Unspecified hearing loss, unspecified ear: Secondary | ICD-10-CM | POA: Diagnosis not present

## 2022-04-03 DIAGNOSIS — R5383 Other fatigue: Secondary | ICD-10-CM | POA: Diagnosis not present

## 2022-04-03 DIAGNOSIS — K5900 Constipation, unspecified: Secondary | ICD-10-CM | POA: Diagnosis not present

## 2022-04-03 DIAGNOSIS — H9319 Tinnitus, unspecified ear: Secondary | ICD-10-CM | POA: Diagnosis not present

## 2022-04-03 DIAGNOSIS — E789 Disorder of lipoprotein metabolism, unspecified: Secondary | ICD-10-CM

## 2022-04-03 NOTE — Patient Instructions (Addendum)
1) Fasting labs 2) Follow up appt in 3 months, 2 weeks, fasting labs prior 3) ENT referral

## 2022-04-03 NOTE — Progress Notes (Signed)
New Patient Office Visit  Subjective    Patient ID: Maureen Aguilar, female    DOB: [birth year]  Age: 47 y.o. MRN: [id number]  CC:  Chief Complaint  Patient presents with   New Patient (Initial Visit)    New Patient    HPI Maureen Aguilar presents to establish care Pt new to practice.  Hx of insomnia.  Fasting for labs today.    Outpatient Encounter Medications as of 04/03/2022  Medication Sig   levonorgestrel (MIRENA) 20 MCG/DAY IUD 1 each by Intrauterine route once.   Olopatadine HCl 0.2 % SOLN Administer 1 drop to both eyes Two (2) times a day.   omeprazole (PRILOSEC) 20 MG capsule Take 40 mg by mouth daily.   traZODone (DESYREL) 50 MG tablet TAKE 1-2 TABLET BY MOUTH AT BEDTIME AS NEEDED FOR SLEEP. Office visit required for further refills.   cephALEXin (KEFLEX) 500 MG capsule Take 1 capsule (500 mg total) by mouth 2 (two) times daily. (Patient not taking: Reported on 04/03/2022)   citalopram (CELEXA) 10 MG tablet TAKE 1 TABLET(10 MG) BY MOUTH DAILY (Patient not taking: Reported on 04/03/2022)   No facility-administered encounter medications on file as of 04/03/2022.    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    Family History  Problem Relation Age of Onset   Alcohol abuse Mother    Arthritis Mother    Mental illness Mother    Mental illness Brother        bipolar   Cancer Maternal Aunt        breast   Breast cancer Maternal Aunt 29   Mental illness Maternal Grandmother    Asthma Maternal Grandmother    Hyperlipidemia Maternal Grandmother    Hypertension Maternal Grandmother    Heart  disease Maternal Grandfather     Social History   Socioeconomic History   Marital status: Single    Spouse name: Not on file   Number of children: Not on file   Years of education: Not on file   Highest education level: Not on file  Occupational History   Not on file  Tobacco Use   Smoking status: Never   Smokeless tobacco: Never  Vaping Use   Vaping Use: Never used  Substance and Sexual Activity   Alcohol use: No    Alcohol/week: 0.0 standard drinks of alcohol   Drug use: Not Currently   Sexual activity: Not on file  Other Topics Concern   Not on file  Social History Narrative   Single.   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   Highest level of education 12th grade.   Enjoys reading, driving.   Social Determinants of Health   Financial Resource Strain: Not on file  Food Insecurity: Not on file  Transportation Needs: Not on file  Physical Activity: Not on file  Stress: Not on file  Social Connections: Not on file  Intimate Partner Violence: Not on file  Review of Systems  Constitutional:  Positive for malaise/fatigue.  HENT:  Positive for congestion, hearing loss and tinnitus.   Eyes: Negative.   Respiratory: Negative.    Cardiovascular: Negative.   Gastrointestinal:  Positive for heartburn.  Genitourinary: Negative.   Musculoskeletal:  Positive for back pain.  Skin: Negative.   Neurological: Negative.   Endo/Heme/Allergies: Negative.   Psychiatric/Behavioral:  The patient has insomnia.         Objective    BP 108/62   Pulse 83   Ht 5' 5.5" (1.664 m)   Wt 198 lb (89.8 kg)   SpO2 98%   BMI 32.45 kg/m   Physical Exam Vitals reviewed.  Constitutional:      Appearance: Normal appearance.  HENT:     Head: Normocephalic.     Nose: Nose normal.     Mouth/Throat:     Mouth: Mucous membranes are moist.  Eyes:     Pupils: Pupils are equal, round, and reactive to light.  Cardiovascular:     Rate and Rhythm: Normal  rate and regular rhythm.  Pulmonary:     Effort: Pulmonary effort is normal.     Breath sounds: Normal breath sounds.  Abdominal:     General: Bowel sounds are normal.     Palpations: Abdomen is soft.  Musculoskeletal:        General: Normal range of motion.     Cervical back: Normal range of motion and neck supple.  Skin:    General: Skin is warm and dry.  Neurological:     Mental Status: She is alert and oriented to person, place, and time.  Psychiatric:        Mood and Affect: Mood normal.        Behavior: Behavior normal.         Assessment & Plan:   Problem List Items Addressed This Visit       Nervous and Auditory   Hearing loss     Other   Tinnitus - Primary   Relevant Orders   Ambulatory referral to ENT   Constipation    Return in about 3 months (around 07/04/2022).   Evern Bio, NP

## 2022-04-04 LAB — LIPID PANEL
Chol/HDL Ratio: 4.2 ratio (ref 0.0–4.4)
Cholesterol, Total: 197 mg/dL (ref 100–199)
HDL: 47 mg/dL (ref >39)
LDL Chol Calc (NIH): 123 mg/dL — ABNORMAL HIGH (ref 0–99)
Triglycerides: 151 mg/dL — ABNORMAL HIGH (ref 0–149)
VLDL Cholesterol Cal: 27 mg/dL (ref 5–40)

## 2022-04-04 LAB — CMP14+EGFR
ALT: 15 IU/L (ref 0–32)
AST: 13 IU/L (ref 0–40)
Albumin/Globulin Ratio: 1.6 (ref 1.2–2.2)
Albumin: 4.3 g/dL (ref 3.9–4.9)
Alkaline Phosphatase: 92 IU/L (ref 44–121)
BUN/Creatinine Ratio: 17 (ref 9–23)
BUN: 16 mg/dL (ref 6–24)
Bilirubin Total: 0.5 mg/dL (ref 0.0–1.2)
CO2: 23 mmol/L (ref 20–29)
Calcium: 10.2 mg/dL (ref 8.7–10.2)
Chloride: 102 mmol/L (ref 96–106)
Creatinine, Ser: 0.95 mg/dL (ref 0.57–1.00)
Globulin, Total: 2.7 g/dL (ref 1.5–4.5)
Glucose: 95 mg/dL (ref 70–99)
Potassium: 4.3 mmol/L (ref 3.5–5.2)
Sodium: 140 mmol/L (ref 134–144)
Total Protein: 7 g/dL (ref 6.0–8.5)
eGFR: 74 mL/min/{1.73_m2} (ref >59)

## 2022-04-04 LAB — PARATHYROID HORMONE, INTACT (NO CA): PTH: 33 pg/mL (ref 15–65)

## 2022-04-04 LAB — HEMOGLOBIN A1C
Est. average glucose Bld gHb Est-mCnc: 111 mg/dL
Hgb A1c MFr Bld: 5.5 % (ref 4.8–5.6)

## 2022-04-04 LAB — TSH: TSH: 1.23 u[IU]/mL (ref 0.450–4.500)

## 2022-07-04 ENCOUNTER — Ambulatory Visit: Payer: Managed Care, Other (non HMO) | Admitting: Nurse Practitioner

## 2022-07-24 ENCOUNTER — Other Ambulatory Visit: Payer: Managed Care, Other (non HMO)

## 2022-07-24 DIAGNOSIS — R030 Elevated blood-pressure reading, without diagnosis of hypertension: Secondary | ICD-10-CM

## 2022-07-24 DIAGNOSIS — R5383 Other fatigue: Secondary | ICD-10-CM

## 2022-07-24 DIAGNOSIS — R7301 Impaired fasting glucose: Secondary | ICD-10-CM

## 2022-07-25 LAB — CMP14+EGFR
ALT: 12 IU/L (ref 0–32)
AST: 13 IU/L (ref 0–40)
Albumin: 4.3 g/dL (ref 3.9–4.9)
Alkaline Phosphatase: 92 IU/L (ref 44–121)
BUN/Creatinine Ratio: 12 (ref 9–23)
BUN: 10 mg/dL (ref 6–24)
Bilirubin Total: 0.7 mg/dL (ref 0.0–1.2)
CO2: 22 mmol/L (ref 20–29)
Calcium: 10.1 mg/dL (ref 8.7–10.2)
Chloride: 101 mmol/L (ref 96–106)
Creatinine, Ser: 0.86 mg/dL (ref 0.57–1.00)
Globulin, Total: 2.8 g/dL (ref 1.5–4.5)
Glucose: 82 mg/dL (ref 70–99)
Potassium: 4.3 mmol/L (ref 3.5–5.2)
Sodium: 139 mmol/L (ref 134–144)
Total Protein: 7.1 g/dL (ref 6.0–8.5)
eGFR: 83 mL/min/{1.73_m2} (ref >59)

## 2022-07-25 LAB — HEMOGLOBIN A1C
Est. average glucose Bld gHb Est-mCnc: 111 mg/dL
Hgb A1c MFr Bld: 5.5 % (ref 4.8–5.6)

## 2022-07-25 LAB — CBC WITH DIFFERENTIAL
Basophils Absolute: 0 x10E3/uL (ref 0.0–0.2)
Basos: 1 %
EOS (ABSOLUTE): 0.1 x10E3/uL (ref 0.0–0.4)
Eos: 1 %
Hematocrit: 45.2 % (ref 34.0–46.6)
Hemoglobin: 15.3 g/dL (ref 11.1–15.9)
Immature Grans (Abs): 0 x10E3/uL (ref 0.0–0.1)
Immature Granulocytes: 0 %
Lymphocytes Absolute: 2 x10E3/uL (ref 0.7–3.1)
Lymphs: 24 %
MCH: 30.1 pg (ref 26.6–33.0)
MCHC: 33.8 g/dL (ref 31.5–35.7)
MCV: 89 fL (ref 79–97)
Monocytes Absolute: 0.6 x10E3/uL (ref 0.1–0.9)
Monocytes: 7 %
Neutrophils Absolute: 5.6 x10E3/uL (ref 1.4–7.0)
Neutrophils: 67 %
RBC: 5.09 x10E6/uL (ref 3.77–5.28)
RDW: 12.4 % (ref 11.7–15.4)
WBC: 8.4 x10E3/uL (ref 3.4–10.8)

## 2022-07-25 LAB — LIPID PANEL
Chol/HDL Ratio: 4.3 ratio (ref 0.0–4.4)
Cholesterol, Total: 187 mg/dL (ref 100–199)
HDL: 44 mg/dL (ref >39)
LDL Chol Calc (NIH): 120 mg/dL — ABNORMAL HIGH (ref 0–99)
Triglycerides: 127 mg/dL (ref 0–149)
VLDL Cholesterol Cal: 23 mg/dL (ref 5–40)

## 2022-07-25 LAB — TSH: TSH: 1.45 u[IU]/mL (ref 0.450–4.500)

## 2022-07-29 ENCOUNTER — Ambulatory Visit: Payer: Managed Care, Other (non HMO) | Admitting: Cardiology

## 2022-07-29 ENCOUNTER — Encounter: Payer: Self-pay | Admitting: Cardiology

## 2022-07-29 VITALS — BP 128/82 | HR 70 | Ht 66.0 in | Wt 204.0 lb

## 2022-07-29 DIAGNOSIS — Z1211 Encounter for screening for malignant neoplasm of colon: Secondary | ICD-10-CM | POA: Diagnosis not present

## 2022-07-29 DIAGNOSIS — E789 Disorder of lipoprotein metabolism, unspecified: Secondary | ICD-10-CM

## 2022-07-29 NOTE — Progress Notes (Signed)
Established Patient Office Visit  Subjective:  Patient ID: Maureen Aguilar, female    DOB: 1975/05/13  Age: 47 y.o. MRN: [id number]  Chief Complaint  Patient presents with   Follow-up    3 month follow up, discuss lab results.    Patient in office for 3 month follow up, discuss lab results. Patient doing well, no complaints.  Mammogram done at [facility] 12/2021.  Colonoscopy never done. Patient reports no family history of colon or rectal cancer. Cologuard ordered.  Pap smear done at [facility] 03/2022.  Fasting labs done. LDL 120. Discussed dietary changes vs. medication. Patient would like to try dietary changes and recheck in 3 months. Recommend patient follow a mediterranean diet.     No other concerns at this time.   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    Social History   Socioeconomic History   Marital status: Single    Spouse name: Not on file   Number of children: Not on file   Years of education: Not on file   Highest education level: Not on file  Occupational History   Not on file  Tobacco Use   Smoking status: Never   Smokeless tobacco: Never  Vaping Use   Vaping status: Never Used  Substance and Sexual Activity   Alcohol use: No    Alcohol/week: 0.0 standard drinks of alcohol   Drug use: Not Currently   Sexual activity: Not on file  Other Topics Concern   Not on file  Social History Narrative   Single.   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   Highest level of education 12th grade.   Enjoys reading, driving.   Social Determinants of Health    Financial Resource Strain: Not on file  Food Insecurity: Not on file  Transportation Needs: Not on file  Physical Activity: Not on file  Stress: Not on file  Social Connections: Not on file  Intimate Partner Violence: Not At Risk (07/30/2020)   Received from [facility], [facility]   Humiliation, Afraid, Rape, and Kick questionnaire    Fear of Current or Ex-Partner: No    Emotionally Abused: No    Physically Abused: No    Sexually Abused: No    Family History  Problem Relation Age of Onset   Alcohol abuse Mother    Arthritis Mother    Mental illness Mother    Mental illness Brother        bipolar   Cancer Maternal Aunt        breast   Breast cancer Maternal Aunt 29   Mental illness Maternal Grandmother    Asthma Maternal Grandmother    Hyperlipidemia Maternal Grandmother    Hypertension Maternal Grandmother    Heart disease Maternal Grandfather     No Known Allergies  Review of Systems  Constitutional: Negative.   HENT: Negative.    Eyes: Negative.   Respiratory: Negative.  Negative for shortness of breath.  Cardiovascular: Negative.  Negative for chest pain.  Gastrointestinal: Negative.  Negative for abdominal pain, constipation and diarrhea.  Genitourinary: Negative.   Musculoskeletal:  Negative for joint pain and myalgias.  Skin: Negative.   Neurological: Negative.  Negative for dizziness and headaches.  Endo/Heme/Allergies: Negative.   All other systems reviewed and are negative.      Objective:   BP 128/82 (BP Location: Left Arm, Patient Position: Sitting, Cuff Size: Large)   Pulse 70   Ht 5\' 6"  (1.676 m)   Wt 204 lb (92.5 kg)   SpO2 97%   BMI 32.93 kg/m   Vitals:   07/29/22 0859 07/29/22 0925  BP: (!) 142/84 128/82  Pulse: 70   Height: 5\' 6"  (1.676 m)   Weight: 204 lb (92.5 kg)   SpO2: 97%   BMI (Calculated): 32.94     Physical Exam Vitals and nursing note reviewed.  Constitutional:      Appearance: Normal appearance. She is  normal weight.  HENT:     Head: Normocephalic and atraumatic.     Nose: Nose normal.     Mouth/Throat:     Mouth: Mucous membranes are moist.  Eyes:     Extraocular Movements: Extraocular movements intact.     Conjunctiva/sclera: Conjunctivae normal.     Pupils: Pupils are equal, round, and reactive to light.  Cardiovascular:     Rate and Rhythm: Normal rate and regular rhythm.     Pulses: Normal pulses.     Heart sounds: Normal heart sounds.  Pulmonary:     Effort: Pulmonary effort is normal.     Breath sounds: Normal breath sounds.  Abdominal:     General: Abdomen is flat. Bowel sounds are normal.     Palpations: Abdomen is soft.  Musculoskeletal:        General: Normal range of motion.     Cervical back: Normal range of motion.  Skin:    General: Skin is warm and dry.  Neurological:     General: No focal deficit present.     Mental Status: She is alert and oriented to person, place, and time.  Psychiatric:        Mood and Affect: Mood normal.        Behavior: Behavior normal.        Thought Content: Thought content normal.        Judgment: Judgment normal.      No results found for any visits on 07/29/22.  Recent Results (from the past 2160 hour(s))  Hemoglobin A1c     Status: None   Collection Time: 07/24/22  2:32 PM  Result Value Ref Range   Hgb A1c MFr Bld 5.5 4.8 - 5.6 %    Comment:          Prediabetes: 5.7 - 6.4          Diabetes: >6.4          Glycemic control for adults with diabetes: <7.0    Est. average glucose Bld gHb Est-mCnc 111 mg/dL  TSH     Status: None   Collection Time: 07/24/22  2:32 PM  Result Value Ref Range   TSH 1.450 0.450 - 4.500 uIU/mL  CMP14+EGFR     Status: None   Collection Time: 07/24/22  2:32 PM  Result Value Ref Range   Glucose 82 70 - 99 mg/dL   BUN 10 6 - 24 mg/dL   Creatinine, Ser 9.14 0.57 - 1.00 mg/dL   eGFR 83 >78  mL/min/1.73   BUN/Creatinine Ratio 12 9 - 23   Sodium 139 134 - 144 mmol/L   Potassium 4.3 3.5 - 5.2  mmol/L   Chloride 101 96 - 106 mmol/L   CO2 22 20 - 29 mmol/L   Calcium 10.1 8.7 - 10.2 mg/dL   Total Protein 7.1 6.0 - 8.5 g/dL   Albumin 4.3 3.9 - 4.9 g/dL   Globulin, Total 2.8 1.5 - 4.5 g/dL   Bilirubin Total 0.7 0.0 - 1.2 mg/dL   Alkaline Phosphatase 92 44 - 121 IU/L   AST 13 0 - 40 IU/L   ALT 12 0 - 32 IU/L  Lipid panel     Status: Abnormal   Collection Time: 07/24/22  2:32 PM  Result Value Ref Range   Cholesterol, Total 187 100 - 199 mg/dL   Triglycerides 161 0 - 149 mg/dL   HDL 44 >09 mg/dL   VLDL Cholesterol Cal 23 5 - 40 mg/dL   LDL Chol Calc (NIH) 604 (H) 0 - 99 mg/dL   Chol/HDL Ratio 4.3 0.0 - 4.4 ratio    Comment:                                   T. Chol/HDL Ratio                                             Men  Women                               1/2 Avg.Risk  3.4    3.3                                   Avg.Risk  5.0    4.4                                2X Avg.Risk  9.6    7.1                                3X Avg.Risk 23.4   11.0   CBC With Differential     Status: None   Collection Time: 07/24/22  2:32 PM  Result Value Ref Range   WBC 8.4 3.4 - 10.8 x10E3/uL   RBC 5.09 3.77 - 5.28 x10E6/uL   Hemoglobin 15.3 11.1 - 15.9 g/dL   Hematocrit 54.0 98.1 - 46.6 %   MCV 89 79 - 97 fL   MCH 30.1 26.6 - 33.0 pg   MCHC 33.8 31.5 - 35.7 g/dL   RDW 19.1 47.8 - 29.5 %   Neutrophils 67 Not Estab. %   Lymphs 24 Not Estab. %   Monocytes 7 Not Estab. %   Eos 1 Not Estab. %   Basos 1 Not Estab. %   Neutrophils Absolute 5.6 1.4 - 7.0 x10E3/uL   Lymphocytes Absolute 2.0 0.7 - 3.1 x10E3/uL   Monocytes Absolute 0.6 0.1 - 0.9 x10E3/uL   EOS (ABSOLUTE) 0.1 0.0 - 0.4 x10E3/uL   Basophils Absolute 0.0 0.0 - 0.2 x10E3/uL   Immature  Granulocytes 0 Not Estab. %   Immature Grans (Abs) 0.0 0.0 - 0.1 x10E3/uL    Comment: **Effective August 11, 2022, profile [id number] CBC/Differential**   (No Platelet) will be made non-orderable. [facility] Offers:   [id number] CBC With  Differential/Platelet       Assessment & Plan:  Continue regular mammograms with [facility]. Cologuard ordered. Up to date on pap smear. Follow a mediterranean diet to lower LDL. Continue exercising.   Problem List Items Addressed This Visit       Other   Colon cancer screening - Primary   Relevant Orders   Cologuard   Borderline high serum cholesterol   Relevant Orders   CMP14+EGFR   Lipid Profile    Return in about 4 months (around 11/29/2022) for with fasting labs prior.   Total time spent: 25 minutes  Google, NP  07/29/2022   This document may have been prepared by Dragon Voice Recognition software and as such may include unintentional dictation errors.

## 2022-08-21 NOTE — Progress Notes (Signed)
Patient notified

## 2022-11-25 ENCOUNTER — Other Ambulatory Visit: Payer: Managed Care, Other (non HMO)

## 2022-11-25 DIAGNOSIS — E789 Disorder of lipoprotein metabolism, unspecified: Secondary | ICD-10-CM

## 2022-11-26 LAB — CMP14+EGFR
ALT: 13 [IU]/L (ref 0–32)
AST: 14 [IU]/L (ref 0–40)
Albumin: 4.3 g/dL (ref 3.9–4.9)
Alkaline Phosphatase: 76 [IU]/L (ref 44–121)
BUN/Creatinine Ratio: 14 (ref 9–23)
BUN: 13 mg/dL (ref 6–24)
Bilirubin Total: 0.3 mg/dL (ref 0.0–1.2)
CO2: 21 mmol/L (ref 20–29)
Calcium: 9.3 mg/dL (ref 8.7–10.2)
Chloride: 106 mmol/L (ref 96–106)
Creatinine, Ser: 0.9 mg/dL (ref 0.57–1.00)
Globulin, Total: 2.5 g/dL (ref 1.5–4.5)
Glucose: 97 mg/dL (ref 70–99)
Potassium: 4.5 mmol/L (ref 3.5–5.2)
Sodium: 142 mmol/L (ref 134–144)
Total Protein: 6.8 g/dL (ref 6.0–8.5)
eGFR: 78 mL/min/{1.73_m2} (ref >59)

## 2022-12-01 ENCOUNTER — Encounter: Payer: Self-pay | Admitting: Cardiology

## 2022-12-01 ENCOUNTER — Ambulatory Visit: Payer: Managed Care, Other (non HMO) | Admitting: Cardiology

## 2022-12-01 VITALS — BP 137/83 | HR 87 | Ht 65.0 in | Wt 206.0 lb

## 2022-12-01 DIAGNOSIS — E789 Disorder of lipoprotein metabolism, unspecified: Secondary | ICD-10-CM

## 2022-12-01 DIAGNOSIS — Z1231 Encounter for screening mammogram for malignant neoplasm of breast: Secondary | ICD-10-CM | POA: Diagnosis not present

## 2022-12-01 DIAGNOSIS — Z1329 Encounter for screening for other suspected endocrine disorder: Secondary | ICD-10-CM | POA: Diagnosis not present

## 2022-12-01 DIAGNOSIS — Z0130 Encounter for examination of blood pressure without abnormal findings: Secondary | ICD-10-CM

## 2022-12-01 DIAGNOSIS — Z131 Encounter for screening for diabetes mellitus: Secondary | ICD-10-CM | POA: Diagnosis not present

## 2022-12-01 NOTE — Progress Notes (Signed)
Established Patient Office Visit  Subjective:  Patient ID: Maureen Aguilar, female    DOB: 1975-05-22  Age: 47 y.o. MRN: [id number]  Chief Complaint  Patient presents with   Follow-up    Patient in office for 4 month follow up. Patient doing well, no complaints today.  Patient did not have fasting lab work done. Will return for fasting labs.  Cologuard done, negative.  Due for mammogram soon, will place order.     No other concerns at this time.   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    Social History   Socioeconomic History   Marital status: Single    Spouse name: Not on file   Number of children: Not on file   Years of education: Not on file   Highest education level: Not on file  Occupational History   Not on file  Tobacco Use   Smoking status: Never   Smokeless tobacco: Never  Vaping Use   Vaping status: Never Used  Substance and Sexual Activity   Alcohol use: No    Alcohol/week: 0.0 standard drinks of alcohol   Drug use: Not Currently   Sexual activity: Not on file  Other Topics Concern   Not on file  Social History Narrative   Single.   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   Highest level of education 12th grade.   Enjoys reading, driving.   Social Determinants of Health   Financial Resource Strain: Not on file  Food Insecurity: Not on file  Transportation Needs: Not on file  Physical Activity: Not on file  Stress: Not on file  Social Connections: Not on file  Intimate Partner Violence: Not At Risk (07/30/2020)   Received from [facility], [facility]   Humiliation, Afraid, Rape, and Kick questionnaire    Fear of Current or Ex-Partner: No    Emotionally Abused: No    Physically Abused: No    Sexually Abused: No    Family History  Problem Relation Age of Onset   Alcohol abuse Mother    Arthritis Mother    Mental illness Mother    Mental illness Brother        bipolar   Cancer Maternal Aunt        breast   Breast cancer Maternal Aunt 29   Mental illness Maternal Grandmother    Asthma Maternal Grandmother    Hyperlipidemia Maternal Grandmother    Hypertension Maternal Grandmother    Heart disease Maternal Grandfather     No Known Allergies  Outpatient Medications Prior to Visit  Medication Sig   norethindrone (AYGESTIN) 5 MG tablet Take 5 mg by mouth daily.   levonorgestrel (MIRENA) 20 MCG/DAY IUD 1 each by Intrauterine route once.   omeprazole (PRILOSEC) 20 MG capsule Take 40 mg by mouth daily.   traZODone (DESYREL) 50 MG tablet TAKE 1-2 TABLET BY MOUTH AT BEDTIME AS NEEDED FOR SLEEP. Office visit required for further  refills.   No facility-administered medications prior to visit.    Review of Systems  Constitutional: Negative.   HENT: Negative.    Eyes: Negative.   Respiratory: Negative.  Negative for shortness of breath.   Cardiovascular: Negative.  Negative for chest pain.  Gastrointestinal: Negative.  Negative for abdominal pain, constipation and diarrhea.  Genitourinary: Negative.   Musculoskeletal:  Negative for joint pain and myalgias.  Skin: Negative.   Neurological: Negative.  Negative for dizziness and headaches.  Endo/Heme/Allergies: Negative.   All other systems reviewed and are negative.      Objective:   BP 137/83   Pulse 87   Ht 5\' 5"  (1.651 m)   Wt 206 lb (93.4 kg)   SpO2 98%   BMI 34.28 kg/m   Vitals:   12/01/22 1530  BP: 137/83  Pulse: 87  Height: 5\' 5"  (1.651 m)  Weight: 206 lb (93.4 kg)  SpO2: 98%  BMI (Calculated): 34.28    Physical Exam Vitals and  nursing note reviewed.  Constitutional:      Appearance: Normal appearance. She is normal weight.  HENT:     Head: Normocephalic and atraumatic.     Nose: Nose normal.     Mouth/Throat:     Mouth: Mucous membranes are moist.  Eyes:     Extraocular Movements: Extraocular movements intact.     Conjunctiva/sclera: Conjunctivae normal.     Pupils: Pupils are equal, round, and reactive to light.  Cardiovascular:     Rate and Rhythm: Normal rate and regular rhythm.     Pulses: Normal pulses.     Heart sounds: Normal heart sounds.  Pulmonary:     Effort: Pulmonary effort is normal.     Breath sounds: Normal breath sounds.  Abdominal:     General: Abdomen is flat. Bowel sounds are normal.     Palpations: Abdomen is soft.  Musculoskeletal:        General: Normal range of motion.     Cervical back: Normal range of motion.  Skin:    General: Skin is warm and dry.  Neurological:     General: No focal deficit present.     Mental Status: She is alert and oriented to person, place, and time.  Psychiatric:        Mood and Affect: Mood normal.        Behavior: Behavior normal.        Thought Content: Thought content normal.        Judgment: Judgment normal.      No results found for any visits on 12/01/22.  Recent Results (from the past 2160 hour(s))  CMP14+EGFR     Status: None   Collection Time: 11/25/22  9:11 AM  Result Value Ref Range   Glucose 97 70 - 99 mg/dL   BUN 13 6 - 24 mg/dL   Creatinine, Ser 4.09 0.57 - 1.00 mg/dL   eGFR 78 >81 XB/JYN/8.29   BUN/Creatinine Ratio 14 9 - 23   Sodium 142 134 - 144 mmol/L   Potassium 4.5 3.5 - 5.2 mmol/L   Chloride 106 96 - 106 mmol/L   CO2 21 20 - 29 mmol/L   Calcium 9.3 8.7 - 10.2 mg/dL   Total Protein 6.8 6.0 - 8.5 g/dL   Albumin 4.3 3.9 - 4.9 g/dL   Globulin, Total 2.5 1.5 - 4.5 g/dL   Bilirubin Total 0.3 0.0 - 1.2 mg/dL   Alkaline Phosphatase 76 44 - 121 IU/L   AST 14  0 - 40 IU/L   ALT 13 0 - 32 IU/L      Assessment &  Plan:  Fasting lab work. Mammogram order placed.   Problem List Items Addressed This Visit       Other   Borderline high serum cholesterol - Primary   Relevant Orders   Lipid Profile   Other Visit Diagnoses     Encounter for screening mammogram for malignant neoplasm of breast       Relevant Orders   MM 3D SCREENING MAMMOGRAM BILATERAL BREAST   Diabetes mellitus screening       Relevant Orders   Hemoglobin A1c   Thyroid disorder screening       Relevant Orders   TSH       Return in about 4 months (around 03/31/2023) for fasting lab work prior.   Total time spent: 25 minutes  Google, NP  12/01/2022   This document may have been prepared by Dragon Voice Recognition software and as such may include unintentional dictation errors.

## 2023-07-06 ENCOUNTER — Encounter: Payer: Self-pay | Admitting: Cardiology

## 2023-07-06 ENCOUNTER — Ambulatory Visit: Admitting: Cardiology

## 2023-07-06 VITALS — BP 166/98 | HR 79 | Ht 65.0 in | Wt 208.0 lb

## 2023-07-06 DIAGNOSIS — R0683 Snoring: Secondary | ICD-10-CM

## 2023-07-06 DIAGNOSIS — Z131 Encounter for screening for diabetes mellitus: Secondary | ICD-10-CM | POA: Diagnosis not present

## 2023-07-06 DIAGNOSIS — Z9889 Other specified postprocedural states: Secondary | ICD-10-CM

## 2023-07-06 DIAGNOSIS — E789 Disorder of lipoprotein metabolism, unspecified: Secondary | ICD-10-CM

## 2023-07-06 DIAGNOSIS — I10 Essential (primary) hypertension: Secondary | ICD-10-CM | POA: Insufficient documentation

## 2023-07-06 DIAGNOSIS — Z9089 Acquired absence of other organs: Secondary | ICD-10-CM

## 2023-07-06 DIAGNOSIS — G4700 Insomnia, unspecified: Secondary | ICD-10-CM

## 2023-07-06 MED ORDER — LOSARTAN POTASSIUM 25 MG PO TABS: 25.0000 mg | ORAL_TABLET | Freq: Every day | ORAL | 1 refills | Status: DC

## 2023-07-06 NOTE — Progress Notes (Signed)
 Established Patient Office Visit  Subjective:  Patient ID: Maureen Aguilar, female    DOB: [birth year]  Age: 48 y.o. MRN: [id number]  Chief Complaint  Patient presents with   Acute Visit    Elevated BP at recent doctors visits    Patient in office for an acute visit, complaining of elevated blood pressure. Patient reports blood pressure has been elevated at home. In office today, blood pressure improved on send check, continues to be elevated. Denies excessive caffeine consumption and smoking.  Discussed sleep habits, patient states she has been told she does snore, has insomnia. Will send referral for sleep study.  Will send in losartan 25 mg daily. Discussed checking blood pressure at home.  Patient fasting today, will get lab work.  Hypertension handout given to patient.    No other concerns at this time.   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    Social History   Socioeconomic History   Marital status: Single    Spouse name: Not on file   Number of children: Not on file   Years of education: Not on file   Highest education level: Not on file  Occupational History   Not on file  Tobacco Use   Smoking status: Never   Smokeless tobacco: Never  Vaping Use   Vaping status: Never Used  Substance and Sexual Activity   Alcohol use: No    Alcohol/week: 0.0 standard drinks of alcohol   Drug use: Not Currently   Sexual activity: Not on file  Other Topics Concern   Not on file  Social History Narrative   Single.   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   Highest  level of education 12th grade.   Enjoys reading, driving.   Social Drivers of Corporate investment banker Strain: Not on file  Food Insecurity: Not on file  Transportation Needs: Not on file  Physical Activity: Not on file  Stress: Not on file  Social Connections: Not on file  Intimate Partner Violence: Not At Risk (07/30/2020)   Received from [facility]   Humiliation, Afraid, Rape, and Kick questionnaire    Within the last year, have you been afraid of your partner or ex-partner?: No    Within the last year, have you been humiliated or emotionally abused in other ways by your partner or ex-partner?: No    Within the last year, have you been kicked, hit, slapped, or otherwise physically hurt by your partner or ex-partner?: No    Within the last year, have you been raped or forced to have any kind of sexual activity by your partner or ex-partner?: No    Family History  Problem Relation Age of Onset   Alcohol abuse Mother    Arthritis Mother    Mental illness Mother    Mental illness Brother        bipolar   Cancer Maternal Aunt  breast   Breast cancer Maternal Aunt 29   Mental illness Maternal Grandmother    Asthma Maternal Grandmother    Hyperlipidemia Maternal Grandmother    Hypertension Maternal Grandmother    Heart disease Maternal Grandfather     No Known Allergies  Outpatient Medications Prior to Visit  Medication Sig   levonorgestrel (MIRENA) 20 MCG/DAY IUD 1 each by Intrauterine route once.   norethindrone (AYGESTIN) 5 MG tablet Take 5 mg by mouth daily.   omeprazole (PRILOSEC) 20 MG capsule Take 40 mg by mouth daily.   traZODone  (DESYREL ) 50 MG tablet TAKE 1-2 TABLET BY MOUTH AT BEDTIME AS NEEDED FOR SLEEP. Office visit required for further refills. (Patient not taking: Reported on 07/06/2023)   No facility-administered medications prior to visit.    Review of Systems  Constitutional: Negative.   HENT: Negative.    Eyes: Negative.   Respiratory:  Negative.  Negative for shortness of breath.   Cardiovascular: Negative.  Negative for chest pain.  Gastrointestinal: Negative.  Negative for abdominal pain, constipation and diarrhea.  Genitourinary: Negative.   Musculoskeletal:  Negative for joint pain and myalgias.  Skin: Negative.   Neurological: Negative.  Negative for dizziness and headaches.  Endo/Heme/Allergies: Negative.   All other systems reviewed and are negative.      Objective:   BP (!) 166/98 (BP Location: Left Arm, Patient Position: Sitting, Cuff Size: Large)   Pulse 79   Ht 5' 5 (1.651 m)   Wt 208 lb (94.3 kg)   SpO2 98%   BMI 34.61 kg/m   Vitals:   07/06/23 1009 07/06/23 1028  BP: (!) 176/120 (!) 166/98  Pulse: 79   Height: 5' 5 (1.651 m)   Weight: 208 lb (94.3 kg)   SpO2: 98%   BMI (Calculated): 34.61     Physical Exam Vitals and nursing note reviewed.  Constitutional:      Appearance: Normal appearance. She is normal weight.  HENT:     Head: Normocephalic and atraumatic.     Nose: Nose normal.     Mouth/Throat:     Mouth: Mucous membranes are moist.   Eyes:     Extraocular Movements: Extraocular movements intact.     Conjunctiva/sclera: Conjunctivae normal.     Pupils: Pupils are equal, round, and reactive to light.    Cardiovascular:     Rate and Rhythm: Normal rate and regular rhythm.     Pulses: Normal pulses.     Heart sounds: Normal heart sounds.  Pulmonary:     Effort: Pulmonary effort is normal.     Breath sounds: Normal breath sounds.  Abdominal:     General: Abdomen is flat. Bowel sounds are normal.     Palpations: Abdomen is soft.   Musculoskeletal:        General: Normal range of motion.     Cervical back: Normal range of motion.   Skin:    General: Skin is warm and dry.   Neurological:     General: No focal deficit present.     Mental Status: She is alert and oriented to person, place, and time.   Psychiatric:        Mood and Affect: Mood normal.         Behavior: Behavior normal.        Thought Content: Thought content normal.        Judgment: Judgment normal.      No results found for any visits on 07/06/23.  No results found  for this or any previous visit (from the past 2160 hours).    Assessment & Plan:  Referral for sleep study sent Losartan 25 mg daily Fasting lab work today  Problem List Items Addressed This Visit       Cardiovascular and Mediastinum   Primary hypertension - Primary   Relevant Medications   losartan (COZAAR) 25 MG tablet   Other Relevant Orders   CMP14+EGFR   Ambulatory referral to Sleep Studies     Other   Insomnia   Relevant Orders   Ambulatory referral to Sleep Studies   History of parathyroidectomy   Relevant Orders   Parathyroid  Hormone, Intact w/Ca   TSH   Borderline high serum cholesterol   Relevant Orders   Lipid Profile   Other Visit Diagnoses       Diabetes mellitus screening       Relevant Orders   CMP14+EGFR   Hemoglobin A1c     Snoring       Relevant Orders   Ambulatory referral to Sleep Studies       Return in about 1 week (around 07/13/2023).   Total time spent: 25 minutes  Google, NP  07/06/2023   This document may have been prepared by Dragon Voice Recognition software and as such may include unintentional dictation errors.

## 2023-07-07 LAB — CMP14+EGFR
ALT: 16 IU/L (ref 0–32)
AST: 15 IU/L (ref 0–40)
Albumin: 4.3 g/dL (ref 3.9–4.9)
Alkaline Phosphatase: 83 IU/L (ref 44–121)
BUN/Creatinine Ratio: 14 (ref 9–23)
BUN: 14 mg/dL (ref 6–24)
Bilirubin Total: 0.6 mg/dL (ref 0.0–1.2)
CO2: 20 mmol/L (ref 20–29)
Calcium: 9.5 mg/dL (ref 8.7–10.2)
Chloride: 107 mmol/L — ABNORMAL HIGH (ref 96–106)
Creatinine, Ser: 0.98 mg/dL (ref 0.57–1.00)
Globulin, Total: 2.6 g/dL (ref 1.5–4.5)
Glucose: 88 mg/dL (ref 70–99)
Potassium: 4.6 mmol/L (ref 3.5–5.2)
Sodium: 142 mmol/L (ref 134–144)
Total Protein: 6.9 g/dL (ref 6.0–8.5)
eGFR: 70 mL/min/{1.73_m2} (ref >59)

## 2023-07-07 LAB — LIPID PANEL
Chol/HDL Ratio: 5.2 ratio — ABNORMAL HIGH (ref 0.0–4.4)
Cholesterol, Total: 187 mg/dL (ref 100–199)
HDL: 36 mg/dL — ABNORMAL LOW (ref >39)
LDL Chol Calc (NIH): 126 mg/dL — ABNORMAL HIGH (ref 0–99)
Triglycerides: 141 mg/dL (ref 0–149)
VLDL Cholesterol Cal: 25 mg/dL (ref 5–40)

## 2023-07-07 LAB — HEMOGLOBIN A1C
Est. average glucose Bld gHb Est-mCnc: 120 mg/dL
Hgb A1c MFr Bld: 5.8 % — ABNORMAL HIGH (ref 4.8–5.6)

## 2023-07-07 LAB — TSH: TSH: 1.83 u[IU]/mL (ref 0.450–4.500)

## 2023-07-07 LAB — PTH, INTACT AND CALCIUM: PTH: 50 pg/mL (ref 15–65)

## 2023-07-08 ENCOUNTER — Ambulatory Visit: Payer: Self-pay | Admitting: Cardiology

## 2023-07-13 ENCOUNTER — Encounter: Payer: Self-pay | Admitting: Cardiology

## 2023-07-13 ENCOUNTER — Ambulatory Visit: Admitting: Cardiology

## 2023-07-13 VITALS — BP 138/88 | HR 81 | Ht 66.0 in | Wt 215.0 lb

## 2023-07-13 DIAGNOSIS — I10 Essential (primary) hypertension: Secondary | ICD-10-CM

## 2023-07-13 DIAGNOSIS — E7800 Pure hypercholesterolemia, unspecified: Secondary | ICD-10-CM | POA: Diagnosis not present

## 2023-07-13 DIAGNOSIS — R7303 Prediabetes: Secondary | ICD-10-CM | POA: Diagnosis not present

## 2023-07-13 MED ORDER — LOSARTAN POTASSIUM 50 MG PO TABS: 50.0000 mg | ORAL_TABLET | Freq: Every day | ORAL | 2 refills | Status: DC

## 2023-07-13 NOTE — Progress Notes (Signed)
 Established Patient Office Visit  Subjective:  Patient ID: Maureen Aguilar, female    DOB: [birth year]  Age: 48 y.o. MRN: [id number]  Chief Complaint  Patient presents with   Follow-up    1 week follow up    Patient in office for 1 week follow up on blood pressure, discuss recent lab work. Patient doing well, no complaints today.  Blood pressure continues to be elevated, will increase losartan  to 50 mg daily.  Discussed recent lab work. LDL elevated, handout on mediterranean diet given to patient. Hgb A1c elevated, prediabetic. Decrease sugar and starch intake.  The patient is asked to make an attempt to improve diet and exercise patterns to aid in medical management of this problem.    No other concerns at this time.   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    Social History   Socioeconomic History   Marital status: Single    Spouse name: Not on file   Number of children: Not on file   Years of education: Not on file   Highest education level: Not on file  Occupational History   Not on file  Tobacco Use   Smoking status: Never   Smokeless tobacco: Never  Vaping Use   Vaping status: Never Used  Substance and Sexual Activity   Alcohol use: No    Alcohol/week: 0.0 standard drinks of alcohol   Drug use: Not Currently   Sexual activity: Not on file  Other Topics Concern   Not on file  Social History Narrative   Single.   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   Highest level of education 12th grade.   Enjoys reading, driving.   Social Drivers of Manufacturing engineer Strain: Not on file  Food Insecurity: Not on file  Transportation Needs: Not on file  Physical Activity: Not on file  Stress: Not on file  Social Connections: Not on file  Intimate Partner Violence: Not At Risk (07/30/2020)   Received from [facility]   Humiliation, Afraid, Rape, and Kick questionnaire    Within the last year, have you been afraid of your partner or ex-partner?: No    Within the last year, have you been humiliated or emotionally abused in other ways by your partner or ex-partner?: No    Within the last year, have you been kicked, hit, slapped, or otherwise physically hurt by your partner or ex-partner?: No    Within the last year, have you been raped or forced to have any kind of sexual activity by your partner or ex-partner?: No    Family History  Problem Relation Age of Onset   Alcohol abuse Mother    Arthritis Mother    Mental illness Mother    Mental illness Brother        bipolar   Cancer Maternal Aunt        breast   Breast cancer Maternal Aunt 35   Mental  illness Maternal Grandmother    Asthma Maternal Grandmother    Hyperlipidemia Maternal Grandmother    Hypertension Maternal Grandmother    Heart disease Maternal Grandfather     No Known Allergies  Outpatient Medications Prior to Visit  Medication Sig   levonorgestrel (MIRENA) 20 MCG/DAY IUD 1 each by Intrauterine route once.   norethindrone (GALLIFREY) 5 MG tablet Take 5 mg by mouth.   omeprazole (PRILOSEC) 20 MG capsule Take 40 mg by mouth daily.   [DISCONTINUED] losartan  (COZAAR ) 25 MG tablet Take 1 tablet (25 mg total) by mouth daily.   [DISCONTINUED] norethindrone (AYGESTIN) 5 MG tablet Take 5 mg by mouth daily. (Patient not taking: Reported on 07/13/2023)   [DISCONTINUED] traZODone  (DESYREL ) 50 MG tablet TAKE 1-2 TABLET BY MOUTH AT BEDTIME AS NEEDED FOR SLEEP. Office visit required for further refills. (Patient not taking: Reported on 07/13/2023)   No  facility-administered medications prior to visit.    Review of Systems  Constitutional: Negative.   HENT: Negative.    Eyes: Negative.   Respiratory: Negative.  Negative for shortness of breath.   Cardiovascular: Negative.  Negative for chest pain.  Gastrointestinal: Negative.  Negative for abdominal pain, constipation and diarrhea.  Genitourinary: Negative.   Musculoskeletal:  Negative for joint pain and myalgias.  Skin: Negative.   Neurological: Negative.  Negative for dizziness and headaches.  Endo/Heme/Allergies: Negative.   All other systems reviewed and are negative.      Objective:   BP 138/88 (BP Location: Right Arm, Patient Position: Sitting, Cuff Size: Large)   Pulse 81   Ht 5' 6 (1.676 m)   Wt 215 lb (97.5 kg)   SpO2 98%   BMI 34.70 kg/m   Vitals:   07/13/23 1251 07/13/23 1306  BP: (!) 162/130 138/88  Pulse: 81   Height: 5' 6 (1.676 m)   Weight: 215 lb (97.5 kg)   SpO2: 98%   BMI (Calculated): 34.72     Physical Exam Vitals and nursing note reviewed.  Constitutional:      Appearance: Normal appearance. She is normal weight.  HENT:     Head: Normocephalic and atraumatic.     Nose: Nose normal.     Mouth/Throat:     Mouth: Mucous membranes are moist.   Eyes:     Extraocular Movements: Extraocular movements intact.     Conjunctiva/sclera: Conjunctivae normal.     Pupils: Pupils are equal, round, and reactive to light.    Cardiovascular:     Rate and Rhythm: Normal rate and regular rhythm.     Pulses: Normal pulses.     Heart sounds: Normal heart sounds.  Pulmonary:     Effort: Pulmonary effort is normal.     Breath sounds: Normal breath sounds.  Abdominal:     General: Abdomen is flat. Bowel sounds are normal.     Palpations: Abdomen is soft.   Musculoskeletal:        General: Normal range of motion.     Cervical back: Normal range of motion.   Skin:    General: Skin is warm and dry.   Neurological:     General: No focal deficit  present.     Mental Status: She is alert and oriented to person, place, and time.   Psychiatric:        Mood and Affect: Mood normal.        Behavior: Behavior normal.        Thought Content: Thought content normal.  Judgment: Judgment normal.      No results found for any visits on 07/13/23.  Recent Results (from the past 2160 hours)  CMP14+EGFR     Status: Abnormal   Collection Time: 07/06/23 10:35 AM  Result Value Ref Range   Glucose 88 70 - 99 mg/dL   BUN 14 6 - 24 mg/dL   Creatinine, Ser 9.01 0.57 - 1.00 mg/dL   eGFR 70 >40 fO/fpw/8.26   BUN/Creatinine Ratio 14 9 - 23   Sodium 142 134 - 144 mmol/L   Potassium 4.6 3.5 - 5.2 mmol/L   Chloride 107 (H) 96 - 106 mmol/L   CO2 20 20 - 29 mmol/L   Calcium 9.5 8.7 - 10.2 mg/dL   Total Protein 6.9 6.0 - 8.5 g/dL   Albumin 4.3 3.9 - 4.9 g/dL   Globulin, Total 2.6 1.5 - 4.5 g/dL   Bilirubin Total 0.6 0.0 - 1.2 mg/dL   Alkaline Phosphatase 83 44 - 121 IU/L   AST 15 0 - 40 IU/L   ALT 16 0 - 32 IU/L  Parathyroid  Hormone, Intact w/Ca     Status: None   Collection Time: 07/06/23 10:35 AM  Result Value Ref Range   PTH 50 15 - 65 pg/mL   PTH Interp Comment     Comment: Interpretation                 Intact PTH    Calcium                                 (pg/mL)      (mg/dL) Normal                          15 - 65     8.6 - 10.2 Primary Hyperparathyroidism         >65          >10.2 Secondary Hyperparathyroidism       >65          <10.2 Non-Parathyroid  Hypercalcemia       <65          >10.2 Hypoparathyroidism                  <15          < 8.6 Non-Parathyroid  Hypocalcemia    15 - 65          < 8.6   Lipid Profile     Status: Abnormal   Collection Time: 07/06/23 10:35 AM  Result Value Ref Range   Cholesterol, Total 187 100 - 199 mg/dL   Triglycerides 858 0 - 149 mg/dL   HDL 36 (L) >60 mg/dL   VLDL Cholesterol Cal 25 5 - 40 mg/dL   LDL Chol Calc (NIH) 873 (H) 0 - 99 mg/dL   Chol/HDL Ratio 5.2 (H) 0.0 - 4.4 ratio     Comment:                                   T. Chol/HDL Ratio                                             Men  Women  1/2 Avg.Risk  3.4    3.3                                   Avg.Risk  5.0    4.4                                2X Avg.Risk  9.6    7.1                                3X Avg.Risk 23.4   11.0   Hemoglobin A1c     Status: Abnormal   Collection Time: 07/06/23 10:35 AM  Result Value Ref Range   Hgb A1c MFr Bld 5.8 (H) 4.8 - 5.6 %    Comment:          Prediabetes: 5.7 - 6.4          Diabetes: >6.4          Glycemic control for adults with diabetes: <7.0    Est. average glucose Bld gHb Est-mCnc 120 mg/dL  TSH     Status: None   Collection Time: 07/06/23 10:35 AM  Result Value Ref Range   TSH 1.830 0.450 - 4.500 uIU/mL      Assessment & Plan:  Increase losartan  to 50 ,g daily.  Follow a strict diabetic diet and low fat diet to lower LDL and Hgb A1c.   Problem List Items Addressed This Visit       Cardiovascular and Mediastinum   Primary hypertension - Primary   Relevant Medications   losartan  (COZAAR ) 50 MG tablet     Other   Prediabetes   Elevated LDL cholesterol level    Return in about 2 weeks (around 07/27/2023).   Total time spent: 25 minutes  Google, NP  07/13/2023   This document may have been prepared by Dragon Voice Recognition software and as such may include unintentional dictation errors.

## 2023-07-27 ENCOUNTER — Encounter: Payer: Self-pay | Admitting: Cardiology

## 2023-07-27 ENCOUNTER — Ambulatory Visit: Admitting: Cardiology

## 2023-07-27 VITALS — BP 130/81 | HR 91 | Ht 66.0 in | Wt 213.2 lb

## 2023-07-27 DIAGNOSIS — R7303 Prediabetes: Secondary | ICD-10-CM

## 2023-07-27 DIAGNOSIS — I10 Essential (primary) hypertension: Secondary | ICD-10-CM | POA: Diagnosis not present

## 2023-07-27 NOTE — Progress Notes (Signed)
 Established Patient Office Visit  Subjective:  Patient ID: Maureen Aguilar, female    DOB: 1975/06/24  Age: 48 y.o. MRN: [id number]  Chief Complaint  Patient presents with   Follow-up    2 week follow up    Patient in office for 2 week follow up on blood pressure. Patient blood pressure better controlled on losartan  50 mg daily. Patient tolerating medication. Patient denies dizziness. Continue checking blood pressure at home. Continue same medication.     No other concerns at this time.   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    Social History   Socioeconomic History   Marital status: Single    Spouse name: Not on file   Number of children: Not on file   Years of education: Not on file   Highest education level: Not on file  Occupational History   Not on file  Tobacco Use   Smoking status: Never   Smokeless tobacco: Never  Vaping Use   Vaping status: Never Used  Substance and Sexual Activity   Alcohol use: No    Alcohol/week: 0.0 standard drinks of alcohol   Drug use: Not Currently   Sexual activity: Not on file  Other Topics Concern   Not on file  Social History Narrative   Single.   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   Highest level of education 12th grade.   Enjoys reading, driving.   Social Drivers of Corporate investment banker Strain: Not on file  Food Insecurity: Not on file  Transportation Needs: Not on file  Physical Activity: Not on file  Stress: Not on file  Social Connections: Not on file  Intimate Partner Violence: Not At Risk  (07/30/2020)   Received from [facility]   Humiliation, Afraid, Rape, and Kick questionnaire    Within the last year, have you been afraid of your partner or ex-partner?: No    Within the last year, have you been humiliated or emotionally abused in other ways by your partner or ex-partner?: No    Within the last year, have you been kicked, hit, slapped, or otherwise physically hurt by your partner or ex-partner?: No    Within the last year, have you been raped or forced to have any kind of sexual activity by your partner or ex-partner?: No    Family History  Problem Relation Age of Onset   Alcohol abuse Mother    Arthritis Mother    Mental illness Mother    Mental illness Brother        bipolar   Cancer Maternal Aunt        breast   Breast cancer Maternal Aunt 29   Mental illness Maternal Grandmother    Asthma Maternal Grandmother    Hyperlipidemia Maternal Grandmother    Hypertension Maternal Grandmother    Heart disease Maternal Grandfather     No Known Allergies  Outpatient Medications Prior to Visit  Medication Sig  levonorgestrel (MIRENA) 20 MCG/DAY IUD 1 each by Intrauterine route once.   losartan  (COZAAR ) 50 MG tablet Take 1 tablet (50 mg total) by mouth daily.   norethindrone (GALLIFREY) 5 MG tablet Take 5 mg by mouth.   omeprazole (PRILOSEC) 20 MG capsule Take 40 mg by mouth daily. (Patient taking differently: Take 40 mg by mouth as needed.)   No facility-administered medications prior to visit.    Review of Systems  Constitutional: Negative.   HENT: Negative.    Eyes: Negative.   Respiratory: Negative.  Negative for shortness of breath.   Cardiovascular: Negative.  Negative for chest pain.  Gastrointestinal: Negative.  Negative for abdominal pain, constipation and diarrhea.  Genitourinary: Negative.   Musculoskeletal:  Negative for joint pain and myalgias.  Skin: Negative.   Neurological: Negative.  Negative for dizziness and headaches.   Endo/Heme/Allergies: Negative.   All other systems reviewed and are negative.      Objective:   BP 130/81   Pulse 91   Ht 5' 6 (1.676 m)   Wt 213 lb 3.2 oz (96.7 kg)   SpO2 98%   BMI 34.41 kg/m   Vitals:   07/27/23 1300  BP: 130/81  Pulse: 91  Height: 5' 6 (1.676 m)  Weight: 213 lb 3.2 oz (96.7 kg)  SpO2: 98%  BMI (Calculated): 34.43    Physical Exam Vitals and nursing note reviewed.  Constitutional:      Appearance: Normal appearance. She is normal weight.  HENT:     Head: Normocephalic and atraumatic.     Nose: Nose normal.     Mouth/Throat:     Mouth: Mucous membranes are moist.  Eyes:     Extraocular Movements: Extraocular movements intact.     Conjunctiva/sclera: Conjunctivae normal.     Pupils: Pupils are equal, round, and reactive to light.  Cardiovascular:     Rate and Rhythm: Normal rate and regular rhythm.     Pulses: Normal pulses.     Heart sounds: Normal heart sounds.  Pulmonary:     Effort: Pulmonary effort is normal.     Breath sounds: Normal breath sounds.  Abdominal:     General: Abdomen is flat. Bowel sounds are normal.     Palpations: Abdomen is soft.  Musculoskeletal:        General: Normal range of motion.     Cervical back: Normal range of motion.  Skin:    General: Skin is warm and dry.  Neurological:     General: No focal deficit present.     Mental Status: She is alert and oriented to person, place, and time.  Psychiatric:        Mood and Affect: Mood normal.        Behavior: Behavior normal.        Thought Content: Thought content normal.        Judgment: Judgment normal.      No results found for any visits on 07/27/23.  Recent Results (from the past 2160 hours)  CMP14+EGFR     Status: Abnormal   Collection Time: 07/06/23 10:35 AM  Result Value Ref Range   Glucose 88 70 - 99 mg/dL   BUN 14 6 - 24 mg/dL   Creatinine, Ser 9.01 0.57 - 1.00 mg/dL   eGFR 70 >40 fO/fpw/8.26   BUN/Creatinine Ratio 14 9 - 23   Sodium  142 134 - 144 mmol/L   Potassium 4.6 3.5 - 5.2 mmol/L   Chloride 107 (H) 96 -  106 mmol/L   CO2 20 20 - 29 mmol/L   Calcium 9.5 8.7 - 10.2 mg/dL   Total Protein 6.9 6.0 - 8.5 g/dL   Albumin 4.3 3.9 - 4.9 g/dL   Globulin, Total 2.6 1.5 - 4.5 g/dL   Bilirubin Total 0.6 0.0 - 1.2 mg/dL   Alkaline Phosphatase 83 44 - 121 IU/L   AST 15 0 - 40 IU/L   ALT 16 0 - 32 IU/L  Parathyroid  Hormone, Intact w/Ca     Status: None   Collection Time: 07/06/23 10:35 AM  Result Value Ref Range   PTH 50 15 - 65 pg/mL   PTH Interp Comment     Comment: Interpretation                 Intact PTH    Calcium                                 (pg/mL)      (mg/dL) Normal                          15 - 65     8.6 - 10.2 Primary Hyperparathyroidism         >65          >10.2 Secondary Hyperparathyroidism       >65          <10.2 Non-Parathyroid  Hypercalcemia       <65          >10.2 Hypoparathyroidism                  <15          < 8.6 Non-Parathyroid  Hypocalcemia    15 - 65          < 8.6   Lipid Profile     Status: Abnormal   Collection Time: 07/06/23 10:35 AM  Result Value Ref Range   Cholesterol, Total 187 100 - 199 mg/dL   Triglycerides 858 0 - 149 mg/dL   HDL 36 (L) >60 mg/dL   VLDL Cholesterol Cal 25 5 - 40 mg/dL   LDL Chol Calc (NIH) 873 (H) 0 - 99 mg/dL   Chol/HDL Ratio 5.2 (H) 0.0 - 4.4 ratio    Comment:                                   T. Chol/HDL Ratio                                             Men  Women                               1/2 Avg.Risk  3.4    3.3                                   Avg.Risk  5.0    4.4  2X Avg.Risk  9.6    7.1                                3X Avg.Risk 23.4   11.0   Hemoglobin A1c     Status: Abnormal   Collection Time: 07/06/23 10:35 AM  Result Value Ref Range   Hgb A1c MFr Bld 5.8 (H) 4.8 - 5.6 %    Comment:          Prediabetes: 5.7 - 6.4          Diabetes: >6.4          Glycemic control for adults with diabetes: <7.0    Est.  average glucose Bld gHb Est-mCnc 120 mg/dL  TSH     Status: None   Collection Time: 07/06/23 10:35 AM  Result Value Ref Range   TSH 1.830 0.450 - 4.500 uIU/mL      Assessment & Plan:  Continue same medication  Problem List Items Addressed This Visit       Cardiovascular and Mediastinum   Primary hypertension - Primary    Return in about 3 months (around 10/27/2023) for fasting labs prior.   Total time spent: 25 minutes  [employer], NP  07/27/2023   This document may have been prepared by Dragon Voice Recognition software and as such may include unintentional dictation errors.

## 2023-09-04 ENCOUNTER — Ambulatory Visit: Attending: Sleep Medicine

## 2023-09-04 DIAGNOSIS — R0683 Snoring: Secondary | ICD-10-CM | POA: Insufficient documentation

## 2023-09-04 DIAGNOSIS — G479 Sleep disorder, unspecified: Secondary | ICD-10-CM | POA: Insufficient documentation

## 2023-09-04 DIAGNOSIS — I10 Essential (primary) hypertension: Secondary | ICD-10-CM | POA: Insufficient documentation

## 2023-09-04 DIAGNOSIS — R7303 Prediabetes: Secondary | ICD-10-CM | POA: Insufficient documentation

## 2023-09-04 DIAGNOSIS — G4700 Insomnia, unspecified: Secondary | ICD-10-CM | POA: Insufficient documentation

## 2023-09-04 DIAGNOSIS — E669 Obesity, unspecified: Secondary | ICD-10-CM | POA: Insufficient documentation

## 2023-09-15 ENCOUNTER — Other Ambulatory Visit: Payer: Self-pay | Admitting: Cardiology

## 2023-10-08 ENCOUNTER — Other Ambulatory Visit: Payer: Self-pay | Admitting: Medical Genetics

## 2023-10-19 ENCOUNTER — Other Ambulatory Visit

## 2023-10-19 ENCOUNTER — Other Ambulatory Visit
Admission: RE | Admit: 2023-10-19 | Discharge: 2023-10-19 | Disposition: A | Discharge status: Home / Self Care | Payer: Self-pay | Source: Ambulatory Visit | Attending: Medical Genetics | Admitting: Medical Genetics

## 2023-10-19 DIAGNOSIS — Z9889 Other specified postprocedural states: Secondary | ICD-10-CM

## 2023-10-19 DIAGNOSIS — I10 Essential (primary) hypertension: Secondary | ICD-10-CM

## 2023-10-19 DIAGNOSIS — E789 Disorder of lipoprotein metabolism, unspecified: Secondary | ICD-10-CM

## 2023-10-19 DIAGNOSIS — Z131 Encounter for screening for diabetes mellitus: Secondary | ICD-10-CM

## 2023-10-20 ENCOUNTER — Ambulatory Visit: Payer: Self-pay | Admitting: Cardiology

## 2023-10-20 LAB — CMP14+EGFR
ALT: 16 IU/L (ref 0–32)
AST: 16 IU/L (ref 0–40)
Albumin: 4 g/dL (ref 3.9–4.9)
Alkaline Phosphatase: 73 IU/L (ref 41–116)
BUN/Creatinine Ratio: 12 (ref 9–23)
BUN: 12 mg/dL (ref 6–24)
Bilirubin Total: 0.5 mg/dL (ref 0.0–1.2)
CO2: 19 mmol/L — ABNORMAL LOW (ref 20–29)
Calcium: 10.2 mg/dL (ref 8.7–10.2)
Chloride: 104 mmol/L (ref 96–106)
Creatinine, Ser: 0.98 mg/dL (ref 0.57–1.00)
Globulin, Total: 2.7 g/dL (ref 1.5–4.5)
Glucose: 111 mg/dL — ABNORMAL HIGH (ref 70–99)
Potassium: 4.5 mmol/L (ref 3.5–5.2)
Sodium: 137 mmol/L (ref 134–144)
Total Protein: 6.7 g/dL (ref 6.0–8.5)
eGFR: 70 mL/min/1.73 (ref >59)

## 2023-10-20 LAB — LIPID PANEL
Chol/HDL Ratio: 4.9 ratio — ABNORMAL HIGH (ref 0.0–4.4)
Cholesterol, Total: 177 mg/dL (ref 100–199)
HDL: 36 mg/dL — ABNORMAL LOW (ref >39)
LDL Chol Calc (NIH): 117 mg/dL — ABNORMAL HIGH (ref 0–99)
Triglycerides: 134 mg/dL (ref 0–149)
VLDL Cholesterol Cal: 24 mg/dL (ref 5–40)

## 2023-10-20 LAB — TSH: TSH: 1.4 u[IU]/mL (ref 0.450–4.500)

## 2023-10-20 LAB — HEMOGLOBIN A1C
Est. average glucose Bld gHb Est-mCnc: 126 mg/dL
Hgb A1c MFr Bld: 6 % — ABNORMAL HIGH (ref 4.8–5.6)

## 2023-10-30 LAB — GENECONNECT MOLECULAR SCREEN: Genetic Analysis Overall Interpretation: NEGATIVE

## 2023-11-03 ENCOUNTER — Ambulatory Visit: Admitting: Cardiology

## 2023-12-04 ENCOUNTER — Ambulatory Visit: Admitting: Cardiology

## 2023-12-04 ENCOUNTER — Encounter: Payer: Self-pay | Admitting: Cardiology

## 2023-12-04 VITALS — BP 132/86 | HR 80 | Ht 66.0 in | Wt 220.0 lb

## 2023-12-04 DIAGNOSIS — E7800 Pure hypercholesterolemia, unspecified: Secondary | ICD-10-CM | POA: Diagnosis not present

## 2023-12-04 DIAGNOSIS — R7303 Prediabetes: Secondary | ICD-10-CM

## 2023-12-04 DIAGNOSIS — Z1231 Encounter for screening mammogram for malignant neoplasm of breast: Secondary | ICD-10-CM

## 2023-12-04 DIAGNOSIS — K219 Gastro-esophageal reflux disease without esophagitis: Secondary | ICD-10-CM | POA: Diagnosis not present

## 2023-12-04 DIAGNOSIS — I10 Essential (primary) hypertension: Secondary | ICD-10-CM

## 2023-12-04 MED ORDER — LOSARTAN POTASSIUM 50 MG PO TABS: 50.0000 mg | ORAL_TABLET | Freq: Every day | ORAL | 1 refills | Status: AC

## 2023-12-04 NOTE — Progress Notes (Signed)
 Established Patient Office Visit  Subjective:  Patient ID: Maureen Aguilar, female    DOB: Apr 28, 1975  Age: 48 y.o. MRN: [id number]  Chief Complaint  Patient presents with   Follow-up    3 months follow up    Patient in office for 3 month follow up, discuss recent lab work. Patient doing well, no complaints today. Under increased stress at work.  Discussed recent lab work. Lab stable. Due for mammogram, order placed.  Cologuard 08/2022 negative.  Blood pressure stable. Continue current medications.    No other concerns at this time.   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    Social History   Socioeconomic History   Marital status: Single    Spouse name: Not on file   Number of children: Not on file   Years of education: Not on file   Highest education level: Not on file  Occupational History   Not on file  Tobacco Use   Smoking status: Never   Smokeless tobacco: Never  Vaping Use   Vaping status: Never Used  Substance and Sexual Activity   Alcohol use: No    Alcohol/week: 0.0 standard drinks of alcohol   Drug use: Not Currently   Sexual activity: Not on file  Other Topics Concern   Not on file  Social History Narrative   Single.   Works for [employer] as a museum/gallery conservator.   Works night shift.   Highest level of education 12th grade.   Enjoys reading, driving.   Social Drivers of Corporate Investment Banker Strain: Not on file  Food Insecurity: Not on file  Transportation Needs: Not on file  Physical Activity: Not on file  Stress: Not on file  Social Connections: Not on file   Intimate Partner Violence: Not At Risk (07/30/2020)   Received from [facility]   Humiliation, Afraid, Rape, and Kick questionnaire    Within the last year, have you been afraid of your partner or ex-partner?: No    Within the last year, have you been humiliated or emotionally abused in other ways by your partner or ex-partner?: No    Within the last year, have you been kicked, hit, slapped, or otherwise physically hurt by your partner or ex-partner?: No    Within the last year, have you been raped or forced to have any kind of sexual activity by your partner or ex-partner?: No    Family History  Problem Relation Age of Onset   Alcohol abuse Mother    Arthritis Mother    Mental illness Mother    Mental illness Brother        bipolar   Cancer Maternal Aunt        breast   Breast cancer Maternal Aunt 29   Mental illness Maternal Grandmother    Asthma Maternal Grandmother    Hyperlipidemia Maternal Grandmother    Hypertension Maternal Grandmother    Heart disease Maternal Grandfather     No Known Allergies  Outpatient Medications Prior to Visit  Medication Sig   levonorgestrel (MIRENA) 20 MCG/DAY IUD 1 each by Intrauterine route once.   norethindrone (GALLIFREY) 5 MG tablet Take 5 mg by mouth.   omeprazole (PRILOSEC) 20 MG capsule Take 40 mg by mouth daily. (Patient taking differently: Take 40 mg by mouth daily as needed.)   [DISCONTINUED] losartan  (COZAAR ) 50 MG tablet TAKE 1 TABLET(50 MG) BY MOUTH DAILY   No facility-administered medications prior to visit.    Review of Systems  Constitutional: Negative.   HENT: Negative.    Eyes: Negative.   Respiratory: Negative.  Negative for shortness of breath.   Cardiovascular: Negative.  Negative for chest pain.  Gastrointestinal: Negative.  Negative for abdominal pain, constipation and diarrhea.  Genitourinary: Negative.   Musculoskeletal:  Negative for joint pain and myalgias.  Skin: Negative.   Neurological: Negative.   Negative for dizziness and headaches.  Endo/Heme/Allergies: Negative.   All other systems reviewed and are negative.      Objective:   BP 132/86 (BP Location: Right Arm, Patient Position: Sitting, Cuff Size: Large)   Pulse 80   Ht 5' 6 (1.676 m)   Wt 220 lb (99.8 kg)   SpO2 98%   BMI 35.51 kg/m   Vitals:   12/04/23 1524 12/04/23 1539  BP: (!) 158/92 132/86  Pulse: 80   Height: 5' 6 (1.676 m)   Weight: 220 lb (99.8 kg)   SpO2: 98%   BMI (Calculated): 35.53     Physical Exam Vitals and nursing note reviewed.  Constitutional:      Appearance: Normal appearance. She is normal weight.  HENT:     Head: Normocephalic and atraumatic.     Nose: Nose normal.     Mouth/Throat:     Mouth: Mucous membranes are moist.  Eyes:     Extraocular Movements: Extraocular movements intact.     Conjunctiva/sclera: Conjunctivae normal.     Pupils: Pupils are equal, round, and reactive to light.  Cardiovascular:     Rate and Rhythm: Normal rate and regular rhythm.     Pulses: Normal pulses.     Heart sounds: Normal heart sounds.  Pulmonary:     Effort: Pulmonary effort is normal.     Breath sounds: Normal breath sounds.  Abdominal:     General: Abdomen is flat. Bowel sounds are normal.     Palpations: Abdomen is soft.  Musculoskeletal:        General: Normal range of motion.     Cervical back: Normal range of motion.  Skin:    General: Skin is warm and dry.  Neurological:     General: No focal deficit present.     Mental Status: She is alert and oriented to person, place, and time.  Psychiatric:        Mood and Affect: Mood normal.        Behavior: Behavior normal.        Thought Content: Thought content normal.        Judgment: Judgment normal.      No results found for any visits on 12/04/23.  Recent Results (from the past 2160 hours)  [facility] Molecular Screen - Blood ( Clinical Lab)     Status: None   Collection Time: 10/19/23  9:54 AM  Result Value  Ref Range   Genetic Analysis Overall Interpretation Negative    Genetic Disease Assessed      This is a screening test and does not detect all  pathogenic or likely pathogenic variant(s) in the tested genes; diagnostic testing is recommended for individuals with a personal or family history of heart disease or hereditary cancer. [facility] Tier One  Population Screen is a screening test that analyzes 11 genes related to hereditary breast and ovarian cancer (HBOC) syndrome, Lynch syndrome, and familial hypercholesterolemia. This test only reports clinically significant pathogenic and likely  pathogenic variants but does not report variants of uncertain significance (VUS). In addition, analysis of the PMS2 gene excludes exons 11-15, which overlap with a known pseudogene (PMS2CL).    Genetic Analysis Report      No pathogenic or likely pathogenic variants were detected in the genes analyzed by this test.Genetic test results should be interpreted in the context of an individual's personal medical and family history. Alteration to medical management is NOT  recommended based solely on this result. Clinical correlation is advised.Additional Considerations- This is a screening test; individuals may still carry pathogenic or likely pathogenic variant(s) in the tested genes that are not detected by this test.-  For individuals at risk for these or other related conditions based on factors including personal or family history, diagnostic testing is recommended.- The absence of pathogenic or likely pathogenic variant(s) in the analyzed genes, while reassuring,  does not eliminate the possibility of a hereditary condition; there are other variants and genes associated with heart disease and hereditary cancer that are not included in this test.    Genes Tested See Notes     Comment: APOB, BRCA1, BRCA2, EPCAM, LDLR, LDLRAP1, PCSK9, PMS2, MLH1, MSH2, MSH6   Disclaimer See Notes     Comment: This test was developed and  validated by [facility] This test has not been cleared or approved by the [place]  Food and Drug Administration (FDA). The [facility] laboratory is accredited by the College of American Pathologists (CAP) and certified under  the Clinical Laboratory Improvement Amendments (CLIA #: [id number]) to perform high-complexity clinical tests. This test is used for clinical purposes. It should not be regarded as investigational use only or for research use only.    Sequencing Location See Notes     Comment: Sequencing done at [facility] [address], [address], [place] [address] (CLIA# [id number])   Interpretation Methods and Limitations See Notes     Comment: Extracted DNA is enriched for targeted regions and then sequenced using the [facility] Exome+ (R) assay on an Illumina DNA sequencing system. Data is then aligned to a modified version of GRCh38 and all genes are analyzed using the MANE transcript and MANE  Plus Clinical transcript, when available. Small variant calling is completed using a customized version of [employer]'s DNAseq software, augmented by a proprietary small variant caller for difficult variants. Copy number variants (CNVs) are then called  using a proprietary bioinformatics pipeline based on depth analysis with a comparison to similarly sequenced samples. Analysis of the PMS2 gene is limited to exons 1-10. The interpretation and reporting of variants in APOB, PCSK9, and LDLR is specific to  familial hypercholesterolemia; variants associated with hypobetalipoproteinemia are not included. Interpretation is based upon guidelines published by the [employer] and Gen[employer]), the Association for Mol ecular Pathology  (AMP) or their modification by [employer] when available and/or review of previous clinical assertions available in the [employer]. Interpretation is limited to the transcripts indicated on the report and +/-  10 bp into  intronic regions, except as noted below. [facility] variant classifications include  pathogenic, likely pathogenic, variant of uncertain significance (VUS), likely benign, and benign. Only variants classified as pathogenic and likely pathogenic are included in  the report. All reported variants are confirmed through secondary manual inspection of DNA sequence data or orthogonal testing. Risk estimations and management guidelines included in this report are based on analysis of primary literature and  recommendations of applicable professional societies, and should be regarded as approximations.Based on validation studies,this assay delivers > 99% sensitivity and specificity for single nucleotide variants and insertions  and deletions (indels) up to 20  bp. Larger indels and complex variants are also reported but sensitivity may be reduced. Based on validation studies, this assay delivers > 99% sensitivity to multi-exon CNVs and > 90% sensitivity to single-exon CNVs. This test may not detect variants  in challenging regions (such as short tandem repeats, homopolymer runs, and segment duplications), sub-exonic CNVs, chromosomal aneuploidy, or variants in the presence of mosaicism. Phasing will be attempted and reported, when possible. Structural  rearrangements such as inversions, translocations, and gene conversions are not tested in this assay unless explicitly indicated. Additionally, deep intronic, promoter, and enhancer regions may not be covered. It is important to note that this is a  screening test and cannot detect all disease-causing variants. A negative result does not guarantee the absence of a rare, undetectable variant in the genes analyzed; consider using a diagnostic test if there is  significant personal and/or family history  of one of the conditions analyzed by this test. Any potential incidental findings outside of these genes and conditions will not be identified, nor reported. The  results of a genetic test may be influenced by various factors, including bone marrow  transplantation, blood transfusions, or in rare cases, hematolymphoid neoplasms.Gene Specific Notes:APOB: analysis is limited to c.10580G>A and c.10579C>T; BRCA1: sequencing analysis extends to CDS +/-20 bp; BRCA2: sequencing analysis extends to CDS  +/-20 bp. EPCAM: analysis is limited to CNVof exons 8-9; LDLR: analysis includes CNV ofthe promoter; MLH1: analysis includes CNV of the promoter; PMS2: analysis is limited to exons 1-10.Donnice JINNY Kemp, PhD, [email or web address]   TSH     Status: None   Collection Time: 10/19/23 10:19 AM  Result Value Ref Range   TSH 1.400 0.450 - 4.500 uIU/mL  Hemoglobin A1c     Status: Abnormal   Collection Time: 10/19/23 10:19 AM  Result Value Ref Range   Hgb A1c MFr Bld 6.0 (H) 4.8 - 5.6 %    Comment:          Prediabetes: 5.7 - 6.4          Diabetes: >6.4          Glycemic control for adults with diabetes: <7.0    Est. average glucose Bld gHb Est-mCnc 126 mg/dL  Lipid Profile     Status: Abnormal   Collection Time: 10/19/23 10:19 AM  Result Value Ref Range   Cholesterol, Total 177 100 - 199 mg/dL   Triglycerides 865 0 - 149 mg/dL   HDL 36 (L) >60 mg/dL   VLDL Cholesterol Cal 24 5 - 40 mg/dL   LDL Chol Calc (NIH) 882 (H) 0 - 99 mg/dL   Chol/HDL Ratio 4.9 (H) 0.0 - 4.4 ratio    Comment:                                   T. Chol/HDL Ratio  Men  Women                               1/2 Avg.Risk  3.4    3.3                                   Avg.Risk  5.0    4.4                                2X Avg.Risk  9.6    7.1                                3X Avg.Risk 23.4   11.0   CMP14+EGFR     Status: Abnormal   Collection Time: 10/19/23 10:19 AM  Result Value Ref Range   Glucose 111 (H) 70 - 99 mg/dL   BUN 12 6 - 24 mg/dL   Creatinine, Ser 9.01 0.57 - 1.00 mg/dL   eGFR 70 >40 fO/fpw/8.26   BUN/Creatinine Ratio 12 9 - 23    Sodium 137 134 - 144 mmol/L   Potassium 4.5 3.5 - 5.2 mmol/L   Chloride 104 96 - 106 mmol/L   CO2 19 (L) 20 - 29 mmol/L   Calcium 10.2 8.7 - 10.2 mg/dL   Total Protein 6.7 6.0 - 8.5 g/dL   Albumin 4.0 3.9 - 4.9 g/dL   Globulin, Total 2.7 1.5 - 4.5 g/dL   Bilirubin Total 0.5 0.0 - 1.2 mg/dL   Alkaline Phosphatase 73 41 - 116 IU/L   AST 16 0 - 40 IU/L   ALT 16 0 - 32 IU/L      Assessment & Plan:  Mammogram order placed Continue current medications  Problem List Items Addressed This Visit       Cardiovascular and Mediastinum   Primary hypertension - Primary   Relevant Medications   losartan  (COZAAR ) 50 MG tablet     Digestive   GERD (gastroesophageal reflux disease)     Other   Prediabetes   Elevated LDL cholesterol level   Other Visit Diagnoses       Breast cancer screening by mammogram       Relevant Orders   MM 3D SCREENING MAMMOGRAM BILATERAL BREAST       Return in about 4 months (around 04/02/2024) for fasting lab work prior.   Total time spent: 25 minutes. This time includes review of previous notes and results and patient face to face interaction during today's visit.    Jeoffrey Pollen, NP  12/04/2023   This document may have been prepared by Dragon Voice Recognition software and as such may include unintentional dictation errors.

## 2023-12-16 ENCOUNTER — Other Ambulatory Visit: Payer: Self-pay | Admitting: Cardiology

## 2024-04-08 ENCOUNTER — Ambulatory Visit: Admitting: Cardiology
